# Patient Record
Sex: Female | Born: 1985 | Race: Black or African American | Hispanic: No | Marital: Single | Smoking: Former smoker
Health system: Southern US, Community
[De-identification: ages and names within clinical notes are randomized; demographics above are authoritative.]

## PROBLEM LIST (undated history)

## (undated) DIAGNOSIS — F329 Major depressive disorder, single episode, unspecified: Secondary | ICD-10-CM

## (undated) DIAGNOSIS — F32A Depression, unspecified: Secondary | ICD-10-CM

## (undated) DIAGNOSIS — M67439 Ganglion, unspecified wrist: Secondary | ICD-10-CM

## (undated) DIAGNOSIS — D869 Sarcoidosis, unspecified: Secondary | ICD-10-CM

## (undated) DIAGNOSIS — J309 Allergic rhinitis, unspecified: Secondary | ICD-10-CM

## (undated) HISTORY — DX: Major depressive disorder, single episode, unspecified: F32.9

## (undated) HISTORY — DX: Allergic rhinitis, unspecified: J30.9

## (undated) HISTORY — PX: WISDOM TOOTH EXTRACTION: SHX21

## (undated) HISTORY — DX: Depression, unspecified: F32.A

## (undated) HISTORY — PX: GANGLION CYST EXCISION: SHX1691

---

## 2014-01-10 ENCOUNTER — Emergency Department (HOSPITAL_COMMUNITY): Payer: BC Managed Care – PPO

## 2014-01-10 ENCOUNTER — Encounter (HOSPITAL_COMMUNITY): Payer: Self-pay | Admitting: Emergency Medicine

## 2014-01-10 ENCOUNTER — Emergency Department (INDEPENDENT_AMBULATORY_CARE_PROVIDER_SITE_OTHER): Payer: BC Managed Care – PPO

## 2014-01-10 ENCOUNTER — Emergency Department (INDEPENDENT_AMBULATORY_CARE_PROVIDER_SITE_OTHER)
Admission: EM | Admit: 2014-01-10 | Discharge: 2014-01-10 | Disposition: A | Discharge status: Nursing Home (Includes ICF) | Payer: BC Managed Care – PPO | Source: Home / Self Care

## 2014-01-10 ENCOUNTER — Inpatient Hospital Stay (HOSPITAL_COMMUNITY)
Admission: EM | Admit: 2014-01-10 | Discharge: 2014-01-11 | DRG: 206 | Disposition: A | Discharge status: Home / Self Care | Payer: BC Managed Care – PPO | Attending: Family Medicine | Admitting: Family Medicine

## 2014-01-10 DIAGNOSIS — R9389 Abnormal findings on diagnostic imaging of other specified body structures: Secondary | ICD-10-CM

## 2014-01-10 DIAGNOSIS — R0781 Pleurodynia: Secondary | ICD-10-CM

## 2014-01-10 DIAGNOSIS — R918 Other nonspecific abnormal finding of lung field: Secondary | ICD-10-CM | POA: Diagnosis present

## 2014-01-10 DIAGNOSIS — F1210 Cannabis abuse, uncomplicated: Secondary | ICD-10-CM | POA: Diagnosis present

## 2014-01-10 DIAGNOSIS — R071 Chest pain on breathing: Secondary | ICD-10-CM

## 2014-01-10 DIAGNOSIS — Z681 Body mass index (BMI) 19 or less, adult: Secondary | ICD-10-CM

## 2014-01-10 DIAGNOSIS — F17200 Nicotine dependence, unspecified, uncomplicated: Secondary | ICD-10-CM | POA: Diagnosis present

## 2014-01-10 DIAGNOSIS — D869 Sarcoidosis, unspecified: Secondary | ICD-10-CM | POA: Diagnosis present

## 2014-01-10 DIAGNOSIS — J984 Other disorders of lung: Secondary | ICD-10-CM

## 2014-01-10 LAB — BASIC METABOLIC PANEL
BUN: 7 mg/dL (ref 6–23)
CHLORIDE: 99 meq/L (ref 96–112)
CO2: 22 meq/L (ref 19–32)
CREATININE: 0.69 mg/dL (ref 0.50–1.10)
Calcium: 9.3 mg/dL (ref 8.4–10.5)
GFR calc Af Amer: >90 mL/min (ref >90)
GFR calc non Af Amer: >90 mL/min (ref >90)
GLUCOSE: 87 mg/dL (ref 70–99)
Potassium: 4 mEq/L (ref 3.7–5.3)
Sodium: 136 mEq/L — ABNORMAL LOW (ref 137–147)

## 2014-01-10 LAB — POCT URINALYSIS DIP (DEVICE)
Bilirubin Urine: NEGATIVE
Glucose, UA: NEGATIVE mg/dL
KETONES UR: NEGATIVE mg/dL
Leukocytes, UA: NEGATIVE
Nitrite: NEGATIVE
PROTEIN: NEGATIVE mg/dL
Specific Gravity, Urine: >=1.03 (ref 1.005–1.030)
Urobilinogen, UA: 1 mg/dL (ref 0.0–1.0)
pH: 6 (ref 5.0–8.0)

## 2014-01-10 LAB — CBC WITH DIFFERENTIAL/PLATELET
Basophils Absolute: 0 10*3/uL (ref 0.0–0.1)
Basophils Relative: 1 % (ref 0–1)
Eosinophils Absolute: 0.4 10*3/uL (ref 0.0–0.7)
Eosinophils Relative: 6 % — ABNORMAL HIGH (ref 0–5)
HEMATOCRIT: 39.8 % (ref 36.0–46.0)
Hemoglobin: 14 g/dL (ref 12.0–15.0)
LYMPHS ABS: 1.3 10*3/uL (ref 0.7–4.0)
LYMPHS PCT: 20 % (ref 12–46)
MCH: 32.3 pg (ref 26.0–34.0)
MCHC: 35.2 g/dL (ref 30.0–36.0)
MCV: 91.7 fL (ref 78.0–100.0)
MONO ABS: 0.7 10*3/uL (ref 0.1–1.0)
Monocytes Relative: 11 % (ref 3–12)
NEUTROS ABS: 4.1 10*3/uL (ref 1.7–7.7)
Neutrophils Relative %: 63 % (ref 43–77)
Platelets: 298 10*3/uL (ref 150–400)
RBC: 4.34 MIL/uL (ref 3.87–5.11)
RDW: 13 % (ref 11.5–15.5)
WBC: 6.6 10*3/uL (ref 4.0–10.5)

## 2014-01-10 LAB — POCT PREGNANCY, URINE: Preg Test, Ur: NEGATIVE

## 2014-01-10 LAB — HIV ANTIBODY (ROUTINE TESTING W REFLEX): HIV: NONREACTIVE

## 2014-01-10 MED ORDER — HEPARIN SODIUM (PORCINE) 5000 UNIT/ML IJ SOLN
5000.0000 [IU] | Freq: Three times a day (TID) | INTRAMUSCULAR | Status: DC
  Administered 2014-01-10: 5000 [IU] via SUBCUTANEOUS
  Filled 2014-01-10 (×5): qty 1

## 2014-01-10 MED ORDER — IOHEXOL 350 MG/ML SOLN
80.0000 mL | Freq: Once | INTRAVENOUS | Status: AC | PRN
  Administered 2014-01-10: 55 mL via INTRAVENOUS

## 2014-01-10 MED ORDER — SODIUM CHLORIDE 0.9 % IJ SOLN
3.0000 mL | Freq: Two times a day (BID) | INTRAMUSCULAR | Status: DC
  Administered 2014-01-10 – 2014-01-11 (×2): 3 mL via INTRAVENOUS

## 2014-01-10 MED ORDER — SODIUM CHLORIDE 0.9 % IV BOLUS (SEPSIS)
500.0000 mL | Freq: Once | INTRAVENOUS | Status: AC
  Administered 2014-01-10: 500 mL via INTRAVENOUS

## 2014-01-10 MED ORDER — SODIUM CHLORIDE 0.9 % IV SOLN: 250.0000 mL | INTRAVENOUS | Status: DC | PRN

## 2014-01-10 MED ORDER — SODIUM CHLORIDE 0.9 % IJ SOLN: 3.0000 mL | INTRAMUSCULAR | Status: DC | PRN

## 2014-01-10 NOTE — ED Provider Notes (Signed)
CSN: [id number]     Arrival date & time 01/10/14  1024 History   First MD Initiated Contact with Patient 01/10/14 1103     Chief Complaint  Patient presents with  . Back Pain   (Consider location/radiation/quality/duration/timing/severity/associated sxs/prior Treatment) HPI Comments: 27 year old female complaining of pain to the left lateral and posterior lateral ribs. Pain is greatest when taking a deep breath and with any type of movement. It started approximately 2 weeks ago. She denies anterior chest pain or shortness of breath. No known injury, trauma or positive wheezing. Daily smoker.   History reviewed. No pertinent past medical history. History reviewed. No pertinent past surgical history. No family history on file. History  Substance Use Topics  . Smoking status: Current Every Day Smoker  . Smokeless tobacco: Not on file  . Alcohol Use: No   OB History   Grav Para Term Preterm Abortions TAB SAB Ect Mult Living                 Review of Systems  Constitutional: Positive for activity change. Negative for fever and chills.  HENT: Negative.   Respiratory: Positive for cough. Negative for chest tightness and shortness of breath.   Cardiovascular: Positive for chest pain. Negative for palpitations and leg swelling.  Gastrointestinal: Negative.   Genitourinary: Negative.   Musculoskeletal:       As per HPI  Skin: Negative for color change and rash.  Neurological: Negative.     Allergies  Review of patient's allergies indicates no known allergies.  Home Medications  No current outpatient prescriptions on file. BP 122/74  Pulse 78  Temp(Src) 98.6 F (37 C) (Oral)  Resp 16  SpO2 100%  LMP 12/14/2013 Physical Exam  Nursing note and vitals reviewed. Constitutional: She is oriented to person, place, and time. She appears well-developed and well-nourished. No distress.  HENT:  Head: Normocephalic and atraumatic.  Eyes: EOM are normal.  Neck: Normal range of  motion. Neck supple.  Cardiovascular: Normal rate, regular rhythm and normal heart sounds.   Pulmonary/Chest: Effort normal. She has no wheezes.  Pleural friction rub in the distant crackles in the left mid lateral and posterior lateral lung field. Note we have for chest wall tenderness. The pain cannot be reproduced with palpation. No rib tenderness.  Abdominal: Soft. There is no tenderness.  Lymphadenopathy:    She has no cervical adenopathy.  Neurological: She is alert and oriented to person, place, and time. No cranial nerve deficit.  Skin: Skin is warm and dry.  Psychiatric: She has a normal mood and affect.    ED Course  Procedures (including critical care time) Labs Review Labs Reviewed  POCT URINALYSIS DIP (DEVICE) - Abnormal; Notable for the following:    Hgb urine dipstick TRACE (*)    All other components within normal limits  POCT PREGNANCY, URINE   Imaging Review Dg Chest 2 View  01/10/2014   CLINICAL DATA:  Back pain for 2 weeks  EXAM: CHEST  2 VIEW  COMPARISON:  None.  FINDINGS: The heart size and mediastinal contours are within normal limits. There are multiple areas of consolidation involving the left upper lobe, left lower lobe, right mid and lower lung. There is no pleural effusion or pulmonary edema. The visualized skeletal structures are unremarkable.  IMPRESSION: Multi lobar pneumonia. Followup after treatment is recommended to ensure resolution.   Electronically Signed   By: Sherian Rein M.D.   On: 01/10/2014 11:55     MDM  1. Pleuritic chest pain   2. Bilateral pulmonary infiltrates on chest x-ray   3. Abnormal CXR      28 y o generally healthy appearing F with L pleuritic chest pain. Abnormal CXR with multiple consolidations. Transfer TO the ED for evaluation of pathology that may include pulmonary embolism , pneumonias, or structural lesions. Pt has no PCP  Hayden Rasmussenavid Heath Badon, NP 01/10/14 1231

## 2014-01-10 NOTE — ED Notes (Signed)
Pt           Masked  For  Protection  Self and  others

## 2014-01-10 NOTE — ED Provider Notes (Signed)
CSN: [id number]     Arrival date & time 01/10/14  1307 History   First MD Initiated Contact with Patient 01/10/14 1325     Chief Complaint  Patient presents with  . Back Pain     (Consider location/radiation/quality/duration/timing/severity/associated sxs/prior Treatment) Patient is a 28 y.o. female presenting with back pain. The history is provided by the patient.  Back Pain Associated symptoms: chest pain   Associated symptoms: no abdominal pain, no headaches, no numbness and no weakness    patient presents with back pain some mild shortness of breath and cough. Sent from urgent care after x-ray showed multifocal infiltrate on x-ray. She's had minimal sputum production. She's been feeling bad for a couple weeks. It is worse with deep breathing and movement. No fevers. No chills. No recent travel. No recent incarceration. Patient is adopted and does not know her family history.  History reviewed. No pertinent past medical history. History reviewed. No pertinent past surgical history. No family history on file. History  Substance Use Topics  . Smoking status: Current Every Day Smoker  . Smokeless tobacco: Not on file  . Alcohol Use: No   OB History   Grav Para Term Preterm Abortions TAB SAB Ect Mult Living                 Review of Systems  Constitutional: Negative for activity change and appetite change.  Eyes: Negative for pain.  Respiratory: Positive for cough and shortness of breath. Negative for chest tightness.   Cardiovascular: Positive for chest pain. Negative for leg swelling.  Gastrointestinal: Negative for nausea, vomiting, abdominal pain and diarrhea.  Genitourinary: Negative for flank pain.  Musculoskeletal: Positive for back pain. Negative for neck stiffness.  Skin: Negative for rash.  Neurological: Negative for weakness, numbness and headaches.  Psychiatric/Behavioral: Negative for behavioral problems.      Allergies  Pollen extract  Home Medications   No current outpatient prescriptions on file. BP 103/65  Pulse 64  Temp(Src) 99.1 F (37.3 C) (Oral)  Resp 16  Ht 5\' 4"  (1.626 m)  Wt 101 lb 3.2 oz (45.904 kg)  BMI 17.36 kg/m2  SpO2 100%  LMP 12/08/2013 Physical Exam  Nursing note and vitals reviewed. Constitutional: She is oriented to person, place, and time. She appears well-developed and well-nourished.  HENT:  Head: Normocephalic and atraumatic.  Eyes: EOM are normal. Pupils are equal, round, and reactive to light.  Neck: Normal range of motion. Neck supple.  Cardiovascular: Normal rate, regular rhythm and normal heart sounds.   No murmur heard. Pulmonary/Chest: Effort normal. No respiratory distress.  Mildly harsh breath sounds with some rales.  Abdominal: Soft. Bowel sounds are normal. She exhibits no distension. There is no tenderness. There is no rebound and no guarding.  Musculoskeletal: Normal range of motion.  Neurological: She is alert and oriented to person, place, and time. No cranial nerve deficit.  Skin: Skin is warm and dry.  Psychiatric: She has a normal mood and affect. Her speech is normal.    ED Course  Procedures (including critical care time) Labs Review Labs Reviewed  CBC WITH DIFFERENTIAL - Abnormal; Notable for the following:    Eosinophils Relative 6 (*)    All other components within normal limits  BASIC METABOLIC PANEL - Abnormal; Notable for the following:    Sodium 136 (*)    All other components within normal limits  HIV ANTIBODY (ROUTINE TESTING)   Imaging Review Dg Chest 2 View  01/10/2014   CLINICAL  DATA:  Back pain for 2 weeks  EXAM: CHEST  2 VIEW  COMPARISON:  None.  FINDINGS: The heart size and mediastinal contours are within normal limits. There are multiple areas of consolidation involving the left upper lobe, left lower lobe, right mid and lower lung. There is no pleural effusion or pulmonary edema. The visualized skeletal structures are unremarkable.  IMPRESSION: Multi lobar  pneumonia. Followup after treatment is recommended to ensure resolution.   Electronically Signed   By: Sherian Rein M.D.   On: 01/10/2014 11:55   Ct Angio Chest W/cm &/or Wo Cm  01/10/2014   CLINICAL DATA:  Chest pain  EXAM: CT ANGIOGRAPHY CHEST WITH CONTRAST  TECHNIQUE: Multidetector CT imaging of the chest was performed using the standard protocol during bolus administration of intravenous contrast. Multiplanar CT image reconstructions and MIPs were obtained to evaluate the vascular anatomy.  CONTRAST:  55mL OMNIPAQUE IOHEXOL 350 MG/ML SOLN  COMPARISON:  None.  FINDINGS: There is adequate opacification of the pulmonary arteries. There is no pulmonary embolus. The main pulmonary artery, right main pulmonary artery and left main pulmonary arteries are normal in size. The heart size is normal. There is no pericardial effusion.  There are focal parenchymal opacities involving bilateral upper lobes, right middle lobe and bilateral lower lobes. The largest area in the left upper lobe measures 3.6 cm. There is an adjacent 2.8 cm spiculated area of airspace disease with areas of cavitation. The largest area in the left lower lobe 4.5 cm. The largest area in the right middle lobe measures 4.9 cm. The largest area in the right lower lobe may reduce 3.3 cm with a large area of cavitation. There is right hilar lymphadenopathy measuring 14 mm in short axis. There is left axillary lymphadenopathy measuring 11 mm in short axis. There is subcarinal lymphadenopathy measuring 18 mm in short axis. There is prevascular adenopathy measuring 10 mm in short axis.  There is no axillary, hilar, or mediastinal adenopathy.  There is no lytic or blastic osseous lesion.  The visualized portions of the upper abdomen are unremarkable.  Review of the MIP images confirms the above findings.  IMPRESSION: 1. No CT evidence of pulmonary embolus. 2. Bilateral areas of focal consolidation with a large area of cavitation in the largest right lower  lobe consolidative area and small areas of cavitation in a left upper lobe consolidative area. This may reflect multilobar pneumonia with areas of necrotizing pneumonia versus tuberculosis versus Wegner's granulomatosis.   Electronically Signed   By: Elige Ko   On: 01/10/2014 15:22     EKG Interpretation None      MDM   Final diagnoses:  Pulmonary infiltrate    Patient with multifocal pulmonary infiltrate. No pulmonary embolism. Could be multifocal pneumonia versus TB versus other cause. Will leave antibiotic choice up to admitting team. Will admit to family practice since patient does not have a PCP    Juliet Rude. Rubin Payor, MD 01/10/14 1553

## 2014-01-10 NOTE — ED Notes (Signed)
Pt  Reports  Pain  l  Upper  Back       With  Symptoms  For  sev     Weeks      Pt  denys  Any  Injury          Pt  Reports   Pain     Worse  On  Movement and  posistion        Pt  denys  Any    Urinary  Symptoms

## 2014-01-10 NOTE — H&P (Signed)
[facility] Admission History and Physical Service Pager: [phone]  Patient name: Ariel Henson Medical record number: [id number] Date of birth: 1986/01/10 Age: 28 y.o. Gender: female  Primary Care Provider: No PCP Per Patient Consultants: Pulmonology  Code Status: Full   Chief Complaint: Dyspnea, cavitary lung lesions   Assessment and Plan: Ariel Henson is a 28 y.o. female presenting with cavitary lung lessions . PMH is significant for tobacco use.  #Dyspnea/Cavitary Lung Lesions - Pt found to have RLL and LUL cavitary lung lesions on CT, no evidence of PE.  No clinical features to suggest TB picture.  DDx includes autoimmune disease such as Wegener's/Goodpastures (no evidence of renal involvement), TB latent, hypersensitive pneumonitis (occupational hazard), malignancy (28 y/o w/o other Sx), infectious (no fever, no leukocytosis), alpha 1 antitrypsin (low likelihood), or possible previous fungal exposure (aspergillosis, histoplasmosis, actinomycosis, coccidioidosis, blastomycosis).  - Admit to med surg, vitals per unit w/ pulse oximetry check  - Since pt is afebrile and no leukocytosis, will hold off on ABx tx.  If becomes febrile, would draw BCx, start on clindamycin for her cavitary lesions and discuss case with pharmacy.  - Consult to Pulmonology, greatly appreciate assessment and recommendations.  Discussed with pt she may need BAL if lab testing is equivocal.  - Airborne precautions and Quantiferon Gold, Sputum Cxs - CBC and CMET in AM, repeat CXR as indicated - F/U HIV    FEN/GI: Regular, SLIV Prophylaxis: SQ Hep   Disposition: Med-Surg pending further evaluation   History of Present Illness: Ariel Henson is a 28 y.o. female presenting with cavitary lung lesions.  Pt states that she started to have left chest pain about 2 weeks ago, sharp in nature, that has been pretty consistent, low pain, until last night when she had more severe pleuritic chest  pain last PM.  States she has had slight non productive cough, no fever, no chills, no night sweats, no N/V/D, no recent prison visits, denies IVDA, denies heavy alcohol use, denies recent travel outside of [place] in the past 6 months.  Pt has had one sexual partner in the past 3 months, protected, and has not been tested for HIV prior to this.  Denies recent travel into caves or to the [place] or [place].  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  Does smoke about 1/3 ppd for the last 7 yrs.  Specifically, denies any arthralgias, hematuria, low back pain, epistaxis, hemoptysis, skin lesions or rash.    Pt went initially to urgent care where they performed a CXR showing these lesions and she was sent to the ED for further evaluation.  As well, BMET performed which did not show gross abnormalities, CBC w/o leukocytosis or left shift, and CTA showing two regions (RLL and LUL) of cavitary lesions.   Pt is adopted so she is unsure of her history.  Previously lived in [place] in 1 story house, built in the last 25 yrs, and moved to [place] in 2009.   Review Of Systems: Per HPI   Patient Active Problem List   Diagnosis Date Noted  . Pulmonary infiltrates 01/10/2014   Past Medical History: History reviewed. No pertinent past medical history. Past Surgical History: History reviewed. No pertinent past surgical history. Social History: History  Substance Use Topics  . Smoking status: Current Every Day Smoker  . Smokeless tobacco: Not on file  . Alcohol  Use: No    Family History: No family history on file. Allergies and Medications: Allergies  Allergen Reactions  . Pollen Extract Other (See Comments)    Seasonal allergies - Runny nose   No current facility-administered medications on file prior to encounter.   No current outpatient prescriptions on file prior to encounter.     Objective: BP 113/68  Pulse 69  Temp(Src) 99.1 F (37.3 C) (Oral)  Resp 16  Ht 5\' 4"  (1.626 m)  Wt 101 lb 3.2 oz (45.904 kg)  BMI 17.36 kg/m2  SpO2 100%  LMP 12/08/2013 Exam: General: NAD, comfortable in bed HEENT: [place]/AT, clear O/P, MMM, EOMI B/L, no scleral icterus  Neck: No LAD Cardiovascular: RRR, no murmurs appreciated  Respiratory: No respiratory distress, no increased WOB, no accessory muscle use Abdomen: Soft/ NT/ND, NABS, No CVA tenderness  Extremities:  No edema, + 2 LE pulses B/L  Skin: No rashes, epidermal growths, moles, or erythema  Neuro: CN 2-12 intact, no focal deficits  Labs and Imaging: CBC BMET   Recent Labs Lab 01/10/14 1337  WBC 6.6  HGB 14.0  HCT 39.8  PLT 298    Recent Labs Lab 01/10/14 1337  NA 136*  K 4.0  CL 99  CO2 22  BUN 7  CREATININE 0.69  GLUCOSE 87  CALCIUM 9.3     CTA 01/10/14 IMPRESSION:  1. No CT evidence of pulmonary embolus.  2. Bilateral areas of focal consolidation with a large area of  cavitation in the largest right lower lobe consolidative area and  small areas of cavitation in a left upper lobe consolidative area.  This may reflect multilobar pneumonia with areas of necrotizing  pneumonia versus tuberculosis versus Wegner's granulomatosis.   Twana FirstBryan R. Paulina FusiHess, DO of [facility] 01/10/2014, 5:48 PM

## 2014-01-10 NOTE — ED Provider Notes (Signed)
Medical screening examination/treatment/procedure(s) were performed by non-physician practitioner and as supervising physician I was immediately available for consultation/collaboration.  Enio Hornback, M.D.  Undra Harriman C Shonta Phillis, MD 01/10/14 2257 

## 2014-01-10 NOTE — Consult Note (Addendum)
Name: Samul DadaOdette Vorndran MRN: [id number] DOB: 1986/03/21    ADMISSION DATE:  01/10/2014 CONSULTATION DATE:  3/19 REFERRING MD :  Gwendolyn GrantWalden  PRIMARY SERVICE:  IMTS  CHIEF COMPLAINT:  Chest pain and cavitary lung lesions   BRIEF PATIENT DESCRIPTION:  This is a 28 year old w/ no sig medical history. Admitted from urgent care on 3/19 for further evaluation of bilateral focal consolidations w/ large areas of consolidations. PCCM has been asked to eval and assist w/ further evaluation of abnormal CT findings.   SIGNIFICANT EVENTS / STUDIES:  CT chest 3/19: 2. Bilateral areas of focal consolidation with a large area of cavitation in the largest right lower lobe consolidative area and small areas of cavitation in a left upper lobe consolidative area. This may reflect multilobar pneumonia with areas of necrotizing pneumonia versus tuberculosis versus Wegner's granulomatosis.    LINES / TUBES:   CULTURES:   ANTIBIOTICS:   HISTORY OF PRESENT ILLNESS:   28 y.o. female presented to ER w/ CC: 2 wk h/o left sided CP. Went to urgent care where CXR showed cavitary lung lesions. Pt states that she started to have left chest pain about 2 weeks ago, sharp in nature, that has been pretty consistent, low pain, until the night prior to admit where it worsened in intensity. States she has had slight non productive cough, no fever, no chills, no night sweats, no N/V/D, no recent prison visits, denies IVDA, denied heavy alcohol use, denies recent travel outside of [place] in the past 6 months. Pt has had one sexual partner in the past 3 months, protected, and has not been tested for HIV prior to this. Den[place] recent travel into [place]. 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 Does smoke about 1/3 ppd for the last 7 yrs. Specifically, denies any arthralgias, hematuria, low back pain,  epistaxis, hemoptysis, skin lesions or rash.    PAST MEDICAL HISTORY :  Past Medical History  Diagnosis Date  . Herpes    History reviewed. No pertinent past surgical history. Prior to Admission medications   Not on File   Allergies  Allergen Reactions  . Pollen Extract Other (See Comments)    Seasonal allergies - Runny nose    FAMILY HISTORY:  No family history on file. SOCIAL HISTORY:  reports that she has been smoking.  She does not have any smokeless tobacco history on file. She reports that she does not drink alcohol. Her drug history is not on file.  REVIEW OF SYSTEMS:   Constitutional: Negative for fever, chills, weight loss, malaise/fatigue and diaphoresis.  HENT: Negative for hearing loss, ear pain, nosebleeds, congestion, sore throat, neck pain, tinnitus and ear discharge.   Eyes: Negative for blurred vision, double vision, photophobia, pain, discharge and redness.  Respiratory: Negative for cough, hemoptysis, sputum production, shortness of breath, wheezing and stridor.  Pleuritic type chest pain Cardiovascular: Negative for chest pain, palpitations, orthopnea, claudication, leg swelling and PND.  Gastrointestinal: Negative for heartburn, nausea, vomiting, abdominal pain, diarrhea, constipation, blood in stool and melena.  Genitourinary: Negative for dysuria, urgency, frequency, hematuria and flank pain.  Musculoskeletal: Negative for myalgias, back pain, joint pain and falls.  Skin: Negative for itching and rash.  Neurological: Negative for dizziness, tingling, tremors, sensory change, speech change, focal weakness, seizures, loss of consciousness, weakness and headaches.  Endo/Heme/Allergies: Negative for environmental allergies  and polydipsia. Does not bruise/bleed easily.  SUBJECTIVE:  No distress   VITAL SIGNS: Temp:  [98.6 F (37 C)-99.1 F (37.3 C)] 98.8 F (37.1 C) (03/19 2039) Pulse Rate:  [52-78] 73 (03/19 2039) Resp:  [16-22] 20 (03/19 2039) BP:  (103-122)/(57-77) 110/57 mmHg (03/19 2039) SpO2:  [98 %-100 %] 98 % (03/19 2039) Weight:  [45.904 kg (101 lb 3.2 oz)] 45.904 kg (101 lb 3.2 oz) (03/19 1316)  PHYSICAL EXAMINATION: General:  No acute distress  Neuro:  Awake, alert, no focal def  HEENT:  [place], no JVD Cardiovascular:  rrr Lungs:  Clear  Abdomen:  Soft, non-tender  Musculoskeletal:  Intact, no deformities  Skin:  No rashes.    Recent Labs Lab 01/10/14 1337  NA 136*  K 4.0  CL 99  CO2 22  BUN 7  CREATININE 0.69  GLUCOSE 87    Recent Labs Lab 01/10/14 1337  HGB 14.0  HCT 39.8  WBC 6.6  PLT 298   Dg Chest 2 View  01/10/2014   CLINICAL DATA:  Back pain for 2 weeks  EXAM: CHEST  2 VIEW  COMPARISON:  None.  FINDINGS: The heart size and mediastinal contours are within normal limits. There are multiple areas of consolidation involving the left upper lobe, left lower lobe, right mid and lower lung. There is no pleural effusion or pulmonary edema. The visualized skeletal structures are unremarkable.  IMPRESSION: Multi lobar pneumonia. Followup after treatment is recommended to ensure resolution.   Electronically Signed   By: Sherian Rein M.D.   On: 01/10/2014 11:55   Ct Angio Chest W/cm &/or Wo Cm  01/10/2014   CLINICAL DATA:  Chest pain  EXAM: CT ANGIOGRAPHY CHEST WITH CONTRAST  TECHNIQUE: Multidetector CT imaging of the chest was performed using the standard protocol during bolus administration of intravenous contrast. Multiplanar CT image reconstructions and MIPs were obtained to evaluate the vascular anatomy.  CONTRAST:  55mL OMNIPAQUE IOHEXOL 350 MG/ML SOLN  COMPARISON:  None.  FINDINGS: There is adequate opacification of the pulmonary arteries. There is no pulmonary embolus. The main pulmonary artery, right main pulmonary artery and left main pulmonary arteries are normal in size. The heart size is normal. There is no pericardial effusion.  There are focal parenchymal opacities involving bilateral upper lobes, right  middle lobe and bilateral lower lobes. The largest area in the left upper lobe measures 3.6 cm. There is an adjacent 2.8 cm spiculated area of airspace disease with areas of cavitation. The largest area in the left lower lobe 4.5 cm. The largest area in the right middle lobe measures 4.9 cm. The largest area in the right lower lobe may reduce 3.3 cm with a large area of cavitation. There is right hilar lymphadenopathy measuring 14 mm in short axis. There is left axillary lymphadenopathy measuring 11 mm in short axis. There is subcarinal lymphadenopathy measuring 18 mm in short axis. There is prevascular adenopathy measuring 10 mm in short axis.  There is no axillary, hilar, or mediastinal adenopathy.  There is no lytic or blastic osseous lesion.  The visualized portions of the upper abdomen are unremarkable.  Review of the MIP images confirms the above findings.  IMPRESSION: 1. No CT evidence of pulmonary embolus. 2. Bilateral areas of focal consolidation with a large area of cavitation in the largest right lower lobe consolidative area and small areas of cavitation in a left upper lobe consolidative area. This may reflect multilobar pneumonia with areas of necrotizing pneumonia versus tuberculosis versus  Wegner's granulomatosis.   Electronically Signed   By: Elige Ko   On: 01/10/2014 15:22    ASSESSMENT / PLAN: Bilateral scattered cavitary lung masses of unclear etiology. Her physical exam would argue against infectious etiology. DD includes sarcoid (generally does not cavitate) or berylliosis given aerospace industry work, less likely ANCA related or  Malignancy or infectious (no prodrome to suggest TB or infective endocarditis or exposure for  unusual fungal etiology )  plan Send ANA, ANCA, ace level, RA factor and sed rate F/u our office next week If above work-up negative we will need to get tissue sample. Likely CT guided sampling of LLL mass like infiltratewould give best yield.  Smoking  cessation emphasized Rpt UA for RBCs given trace blood  Quantiferon Gold for completion, since she is not coughing , I do not believe she is any risk to community  I can arrange FU   Oretha Milch  Pulmonary and Critical Care Medicine [facility] Pager: [phone]  01/10/2014, 11:16 PM

## 2014-01-10 NOTE — ED Notes (Signed)
Back pain left sid x 2 weeks [facility] and was told may have pneu here for further tests

## 2014-01-10 NOTE — ED Notes (Signed)
Pt moved to negative pressure room..

## 2014-01-10 NOTE — ED Notes (Signed)
Patient brought to x-ray to get CXR. LMP over a month ago, possibility of being pregnant. Returned patient to room and notified nurse Pia MauBowles of possible pregnancy

## 2014-01-11 DIAGNOSIS — R918 Other nonspecific abnormal finding of lung field: Secondary | ICD-10-CM

## 2014-01-11 DIAGNOSIS — J984 Other disorders of lung: Principal | ICD-10-CM

## 2014-01-11 LAB — CBC
HCT: 38.3 % (ref 36.0–46.0)
Hemoglobin: 13.2 g/dL (ref 12.0–15.0)
MCH: 31.7 pg (ref 26.0–34.0)
MCHC: 34.5 g/dL (ref 30.0–36.0)
MCV: 91.8 fL (ref 78.0–100.0)
Platelets: 291 10*3/uL (ref 150–400)
RBC: 4.17 MIL/uL (ref 3.87–5.11)
RDW: 13.1 % (ref 11.5–15.5)
WBC: 6.4 10*3/uL (ref 4.0–10.5)

## 2014-01-11 LAB — RAPID URINE DRUG SCREEN, HOSP PERFORMED
Amphetamines: NOT DETECTED
Barbiturates: NOT DETECTED
Benzodiazepines: NOT DETECTED
Cocaine: NOT DETECTED
Opiates: NOT DETECTED
Tetrahydrocannabinol: POSITIVE — AB

## 2014-01-11 LAB — COMPREHENSIVE METABOLIC PANEL
ALT: 12 U/L (ref 0–35)
AST: 24 U/L (ref 0–37)
Albumin: 3 g/dL — ABNORMAL LOW (ref 3.5–5.2)
Alkaline Phosphatase: 77 U/L (ref 39–117)
BUN: 9 mg/dL (ref 6–23)
CO2: 24 meq/L (ref 19–32)
Calcium: 9.2 mg/dL (ref 8.4–10.5)
Chloride: 102 mEq/L (ref 96–112)
Creatinine, Ser: 0.77 mg/dL (ref 0.50–1.10)
GFR calc Af Amer: >90 mL/min (ref >90)
Glucose, Bld: 96 mg/dL (ref 70–99)
Potassium: 3.8 mEq/L (ref 3.7–5.3)
SODIUM: 139 meq/L (ref 137–147)
TOTAL PROTEIN: 7.4 g/dL (ref 6.0–8.3)
Total Bilirubin: 0.5 mg/dL (ref 0.3–1.2)

## 2014-01-11 LAB — ANGIOTENSIN CONVERTING ENZYME: Angiotensin-Converting Enzyme: 39 U/L (ref 8–52)

## 2014-01-11 LAB — SEDIMENTATION RATE: SED RATE: 39 mm/h — AB (ref 0–22)

## 2014-01-11 MED ORDER — ACETAMINOPHEN 325 MG PO TABS
650.0000 mg | ORAL_TABLET | Freq: Four times a day (QID) | ORAL | Status: DC | PRN
  Administered 2014-01-11: 650 mg via ORAL
  Filled 2014-01-11: qty 2

## 2014-01-11 NOTE — H&P (Signed)
FMTS Attending Admission Note: Renold DonJeff Heily Carlucci MD Personal pager:  [phone] FPTS Service Pager:  [phone]  I  have seen and examined this patient, reviewed their chart. I have discussed this patient with the resident. I agree with the resident's findings, assessment and care plan.  Additionally:  28 yo with no real PMH, adopted so unknonw family hx, who presents with 2 weeks worth of dyspnea and aching back pain which became sharp stabbing pain yesterday.  Presented to Urgent Care for sharp back pain. FOund to have multiple opacities on lung exam, sent to ED. CTA revealed multiple cavitary lesions. Admitted to FPTS.  Exam:   Gen:  AAF, appears stated age, thin but well developed, NAD Lungs:  Clear throughout Heart: RRR Abdomen:  Thin/NT  Imp/Plan: 1. Cavitary lesion; - pulm already consulted, appreciate input.   - broad diff dx:  TB less likely, possibly autoimmune vs fungal - agree with autoimmune labs.  May need tissue samples   -Tobey GrimJeffrey H Marivel Mcclarty, MD 01/11/2014 8:09 AM

## 2014-01-11 NOTE — Progress Notes (Signed)
FMTS ATTENDING  NOTE Ada Holness,MD I have discussed this patient with the resident. I agree with the resident's findings, assessment and care plan.   

## 2014-01-11 NOTE — Progress Notes (Signed)
Called and discussed with ID on-call regarding patient and quantiferon gold test. As she is not having any other signs or symptoms of TB, she does not need to be on airborne precautions and does not need to be kept in the hospital until this result comes back. Overall FPTS and pulmonology does not believe her CXR and CT chest findings are related to TB. If the result does come back positive, it is latent TB and not active disease.  Airborne precautions removed and patient stable for discharge today.  Tawni CarnesAndrew Chrisette Man, MD 01/11/2014, 2:35 PM PGY-1, [facility] FPTS Intern Pager: [phone], text pages welcome

## 2014-01-11 NOTE — Discharge Instructions (Signed)
You were admitted for concern of pneumonia based on your chest x-ray and CT scan. You had no other symptoms of infection and were not started on antibiotics. We had a pulmonologist come and see you to help with the workup to find the cause for the cavitary areas in your lung. We did additional blood testing today that will be pending for several days; the results will be discussed with you at your follow up visits.   Since your tuberculosis test did not come back before you were discharged, it is very important that if you develop fevers, cough, that you do not go to public places and come back to the hospital (let the reception at the emergency department know that you are being tested for TB).  It is very unlikely that you have "Active TB", but there is the possibility that this test comes back positive and that would mean you have "Latent TB", which means you have had an infection in the past. If that is the case, you will still likely need antibiotic therapy, we will likely get you setup with an ID doctor if this is the case.

## 2014-01-11 NOTE — Discharge Summary (Signed)
[facility] Discharge Summary  Patient name: Ariel Henson Medical record number: [id number] Date of birth: [birth year] Age: 28 y.o. 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 female was admitted for left pleuritic chest pain and multilobar cavitary lung lesions on chest x-ray and CT. PMH significant for tobacco and marijuana use. Patient denies risk factors for TB, was afebrile and without cough. She was admitted for further workup; pulmonary consulted and believed this to be very unlikely infectious etiology, favored autoimmune (sarcoid, ANCA related), or berylliosis as patient works with metal on wings at [employer]. Workup including HIV (non-reactive), quantiferon gold TB test, autoimmune workup (see below) drawn and patient was monitored overnight without issues. Discussed with Infectious Disease and given low likelihood of TB, and even if she tests positive for TB she is currently not in active disease, she was stable for discharge home for continued workup as an outpatient.  Issues for Follow Up:  1. Cavitary lesions: pending workup on discharge, follow up scheduled with pulmonary 2. Tobacco/marijuana use: denied marijuana use, will need counseling regarding smoking cessation  Significant Procedures: none  Significant Labs and Imaging:   Recent Labs Lab 01/10/14 1337 01/11/14 0500  WBC 6.6 6.4  HGB 14.0 13.2  HCT 39.8 38.3  PLT 298 291    Recent Labs Lab 01/10/14 1337 01/11/14 0500  NA 136* 139   K 4.0 3.8  CL 99 102  CO2 22 24  GLUCOSE 87 96  BUN 7 9  CREATININE 0.69 0.77  CALCIUM 9.3 9.2  ALKPHOS  --  77  AST  --  24  ALT  --  12  ALBUMIN  --  3.0*   HIV non-reactive Urine drug screen: positive for THC UA: trace hemoglobin ESR: 39 ACE level: 39  Dg Chest 2 View  01/10/2014   CLINICAL DATA:  Back pain for 2 weeks  EXAM: CHEST  2 VIEW  COMPARISON:  None.  FINDINGS: The heart size and mediastinal contours are within normal limits. There are multiple areas of consolidation involving the left upper lobe, left lower lobe, right mid and lower lung. There is no pleural effusion or pulmonary edema. The visualized skeletal structures are unremarkable.  IMPRESSION: Multi lobar pneumonia. Followup after treatment is recommended to ensure resolution.   Electronically Signed   By: Abelardo Diesel M.D.   On: 01/10/2014 11:55   Ct Angio Chest W/cm &/or Wo Cm  01/10/2014   CLINICAL DATA:  Chest pain  EXAM: CT ANGIOGRAPHY CHEST WITH CONTRAST  TECHNIQUE: Multidetector CT imaging of the chest was performed using the standard protocol during bolus administration of intravenous contrast. Multiplanar CT image reconstructions and MIPs were obtained to evaluate the vascular anatomy.  CONTRAST:  72mL OMNIPAQUE IOHEXOL 350 MG/ML SOLN  COMPARISON:  None.  FINDINGS: There is adequate opacification of the pulmonary arteries. There is no pulmonary embolus. The main pulmonary artery, right main pulmonary artery and left main pulmonary arteries are normal in size. The heart size is normal. There is no pericardial effusion.  There are focal parenchymal opacities involving bilateral upper lobes, right middle lobe and bilateral lower  lobes. The largest area in the left upper lobe measures 3.6 cm. There is an adjacent 2.8 cm spiculated area of airspace disease with areas of cavitation. The largest area in the left lower lobe 4.5 cm. The largest area in the right middle lobe measures 4.9 cm. The largest area in the right  lower lobe may reduce 3.3 cm with a large area of cavitation. There is right hilar lymphadenopathy measuring 14 mm in short axis. There is left axillary lymphadenopathy measuring 11 mm in short axis. There is subcarinal lymphadenopathy measuring 18 mm in short axis. There is prevascular adenopathy measuring 10 mm in short axis.  There is no axillary, hilar, or mediastinal adenopathy.  There is no lytic or blastic osseous lesion.  The visualized portions of the upper abdomen are unremarkable.  Review of the MIP images confirms the above findings.  IMPRESSION: 1. No CT evidence of pulmonary embolus. 2. Bilateral areas of focal consolidation with a large area of cavitation in the largest right lower lobe consolidative area and small areas of cavitation in a left upper lobe consolidative area. This may reflect multilobar pneumonia with areas of necrotizing pneumonia versus tuberculosis versus Wegner's granulomatosis.   Electronically Signed   By: Kathreen Devoid   On: 01/10/2014 15:22    Results/Tests Pending at Time of Discharge: ANCA, Quantiferon TB gold  Discharge Medications:    Medication List    Notice   You have not been prescribed any medications.      Discharge Instructions: Please refer to Patient Instructions section of EMR for full details.  Patient was counseled important signs and symptoms that should prompt return to medical care, changes in medications, dietary instructions, activity restrictions, and follow up appointments.   Follow-Up Appointments: Follow-up Information   Follow up with Rigoberto Noel., MD On 01/15/2014. (4 pm)    Specialty:  Pulmonary Disease   Contact information:   [address] [address] [phone]       Follow up with Tawanna Sat, MD On 01/28/2014. (at 11am, For hospital follow up)    Specialty:  Family Medicine   Contact information:   [address] [place] [address] [phone]       Tawanna Sat, MD 01/13/2014, 9:49  PM PGY-1, [place]

## 2014-01-11 NOTE — Progress Notes (Signed)
Family Medicine Teaching Service Daily Progress Note Intern Pager: [phone]  Patient name: Ariel [name] Medical record number: [id number] Date of birth: 08-11-1986 Age: 28 y.o. Gender: female  Primary Care Provider: No PCP Per Patient Consultants: Pulmonology Code Status: Full  Pt Overview and Major Events to Date:   Assessment and Plan: Ariel Henson is a 28 y.o. female presenting with cavitary lung lessions . PMH is significant for tobacco, marijuana use.   # Dyspnea/Cavitary Lung Lesions - Pt found to have RLL and LUL cavitary lung lesions on CT, no evidence of PE. No clinical features to suggest TB picture. DDx includes autoimmune disease such as Wegener's/Goodpastures (no evidence of renal involvement), TB latent, hypersensitive pneumonitis (occupational hazard), malignancy 54(27 y/o w/o other Sx), infectious (no fever, no leukocytosis), alpha 1 antitrypsin (low likelihood), or possible previous fungal exposure (aspergillosis, histoplasmosis, actinomycosis, coccidioidosis, blastomycosis).  - Since pt is afebrile and no leukocytosis, will hold off on ABx tx. If becomes febrile, would draw BCx, start on clindamycin for her cavitary lesions and discuss case with pharmacy.  - Consult to Pulmonology, greatly appreciate assessment and recommendations: suspect autoimmune vs berylliosis, ANCA related; labs sent = ANA, ANCA, ACE level, RA factor, sed rate. Additional workup if that is negative probable CT guided sampling.  - Airborne precautions and Quantiferon Gold, Sputum Cxs  - HIV negative. - very low suspicion for TB, will call ID to discuss need to keep her in hospital until quantiferon Gold TB test comes back  FEN/GI: Regular, SLIV  Prophylaxis: SQ Hep   Disposition: pending quant TB test  Subjective:  No complaints this morning, she is ready to go home. Left chest pain the same (in "background"). Also having some menstrual type pain this morning. No fevers, cough.  Objective: Temp:   [97.7 F (36.5 C)-99.1 F (37.3 C)] 97.7 F (36.5 C) (03/20 0418) Pulse Rate:  [52-78] 70 (03/20 0418) Resp:  [16-22] 22 (03/20 0418) BP: (88-122)/(42-77) 88/42 mmHg (03/20 0418) SpO2:  [98 %-100 %] 99 % (03/20 0418) Weight:  [101 lb 3.2 oz (45.904 kg)] 101 lb 3.2 oz (45.904 kg) (03/19 1316) Physical Exam: General: NAD, comfortable in bed  Neck: No LAD Cardiovascular: RRR, no murmurs appreciated  Respiratory: mostly clear bilaterally, a few scattered crackles. No respiratory distress, no increased WOB, no accessory muscle use  Abdomen: Soft/ NT/ND, NABS, No CVA tenderness  Extremities: No edema, + 2 LE pulses B/L  Skin: No rashes noted  Neuro: alert and oriented, no focal deficits  Laboratory:  Recent Labs Lab 01/10/14 1337 01/11/14 0500  WBC 6.6 6.4  HGB 14.0 13.2  HCT 39.8 38.3  PLT 298 291    Recent Labs Lab 01/10/14 1337 01/11/14 0500  NA 136* 139  K 4.0 3.8  CL 99 102  CO2 22 24  BUN 7 9  CREATININE 0.69 0.77  CALCIUM 9.3 9.2  PROT  --  7.4  BILITOT  --  0.5  ALKPHOS  --  77  ALT  --  12  AST  --  24  GLUCOSE 87 96    HIV non-reactive  Imaging/Diagnostic Tests: Dg Chest 2 View  01/10/2014   CLINICAL DATA:  Back pain for 2 weeks  EXAM: CHEST  2 VIEW  COMPARISON:  None.  FINDINGS: The heart size and mediastinal contours are within normal limits. There are multiple areas of consolidation involving the left upper lobe, left lower lobe, right mid and lower lung. There is no pleural effusion or pulmonary edema. The  visualized skeletal structures are unremarkable.  IMPRESSION: Multi lobar pneumonia. Followup after treatment is recommended to ensure resolution.   Electronically Signed   By: Sherian Rein M.D.   On: 01/10/2014 11:55   Ct Angio Chest W/cm &/or Wo Cm  01/10/2014   CLINICAL DATA:  Chest pain  EXAM: CT ANGIOGRAPHY CHEST WITH CONTRAST  TECHNIQUE: Multidetector CT imaging of the chest was performed using the standard protocol during bolus administration  of intravenous contrast. Multiplanar CT image reconstructions and MIPs were obtained to evaluate the vascular anatomy.  CONTRAST:  55mL OMNIPAQUE IOHEXOL 350 MG/ML SOLN  COMPARISON:  None.  FINDINGS: There is adequate opacification of the pulmonary arteries. There is no pulmonary embolus. The main pulmonary artery, right main pulmonary artery and left main pulmonary arteries are normal in size. The heart size is normal. There is no pericardial effusion.  There are focal parenchymal opacities involving bilateral upper lobes, right middle lobe and bilateral lower lobes. The largest area in the left upper lobe measures 3.6 cm. There is an adjacent 2.8 cm spiculated area of airspace disease with areas of cavitation. The largest area in the left lower lobe 4.5 cm. The largest area in the right middle lobe measures 4.9 cm. The largest area in the right lower lobe may reduce 3.3 cm with a large area of cavitation. There is right hilar lymphadenopathy measuring 14 mm in short axis. There is left axillary lymphadenopathy measuring 11 mm in short axis. There is subcarinal lymphadenopathy measuring 18 mm in short axis. There is prevascular adenopathy measuring 10 mm in short axis.  There is no axillary, hilar, or mediastinal adenopathy.  There is no lytic or blastic osseous lesion.  The visualized portions of the upper abdomen are unremarkable.  Review of the MIP images confirms the above findings.  IMPRESSION: 1. No CT evidence of pulmonary embolus. 2. Bilateral areas of focal consolidation with a large area of cavitation in the largest right lower lobe consolidative area and small areas of cavitation in a left upper lobe consolidative area. This may reflect multilobar pneumonia with areas of necrotizing pneumonia versus tuberculosis versus Wegner's granulomatosis.   Electronically Signed   By: Elige Ko   On: 01/10/2014 15:22    Tawni Carnes, MD 01/11/2014, 8:37 AM PGY-1, [facility] Family Medicine FPTS Intern  pager: [phone], text pages welcome

## 2014-01-11 NOTE — Progress Notes (Signed)
Patient blood pressure 88/42.  Asymptomatic.  MD on call paged; awaiting call back.  Will continue to monitor.

## 2014-01-12 NOTE — Progress Notes (Addendum)
Patient discharge teaching given, including activity, diet, follow-up appoints, and medications. Patient verbalized understanding of all discharge instructions. IV access was d/c'd. Vitals are stable. Skin is intact except as charted in most recent assessments. Pt to be escorted out by RN, to be driven home herself.  Peri MarisAndrew Blanche Scovell, MBA, BS, RN

## 2014-01-14 LAB — ANCA SCREEN W REFLEX TITER
Atypical p-ANCA Screen: NEGATIVE
P-ANCA SCREEN: NEGATIVE
c-ANCA Screen: NEGATIVE

## 2014-01-14 LAB — ANTI-NUCLEAR AB-TITER (ANA TITER): ANA Titer 1: 1:40 {titer} — ABNORMAL HIGH

## 2014-01-14 LAB — ANA: ANA: POSITIVE — AB

## 2014-01-14 NOTE — Discharge Summary (Signed)
FMTS ATTENDING  NOTE Ariel Pangle,MD I  have seen and examined this patient, reviewed their chart. I have discussed this patient with the resident. I agree with the resident's findings, assessment and care plan.   Briefly, suggested infectious disease and pulmonologist clearance before d/c home. Patient need close follow up with the pulmonologist and she is aware of this.

## 2014-01-15 ENCOUNTER — Encounter: Payer: Self-pay | Admitting: Pulmonary Disease

## 2014-01-15 ENCOUNTER — Ambulatory Visit (INDEPENDENT_AMBULATORY_CARE_PROVIDER_SITE_OTHER): Payer: BC Managed Care – PPO | Admitting: Pulmonary Disease

## 2014-01-15 ENCOUNTER — Encounter: Payer: Self-pay | Admitting: Emergency Medicine

## 2014-01-15 VITALS — BP 104/62 | HR 83 | Temp 98.7°F | Ht 64.0 in | Wt 104.8 lb

## 2014-01-15 DIAGNOSIS — J984 Other disorders of lung: Secondary | ICD-10-CM

## 2014-01-15 LAB — QUANTIFERON TB GOLD ASSAY (BLOOD)
Mitogen value: 0.42 IU/mL
Quantiferon Nil Value: 0.04 IU/mL
TB Ag value: 0.04 IU/mL
TB Antigen Minus Nil Value: 0 IU/mL

## 2014-01-15 MED ORDER — ACETAMINOPHEN-CODEINE #3 300-30 MG PO TABS: 1.0000 | ORAL_TABLET | Freq: Three times a day (TID) | ORAL | Status: DC | PRN

## 2014-01-15 NOTE — Patient Instructions (Signed)
Lung biopsy will be scheduled with the radiologist Pain meds Find out about exposure to BERYLLIUM at work

## 2014-01-15 NOTE — Progress Notes (Signed)
   Subjective:    Patient ID: Ariel DadaOdette Henson, female    DOB: 1986-08-10, 28 y.o.   MRN: [id number]  HPI  28 y.o. female presented to ER w/ CC: 2 wk h/o left sided CP. Went to urgent care where CXR showed cavitary lung lesions. Pt states that she started to have left chest pain about 2 weeks ago, sharp in nature, that has been pretty consistent, low pain, until the night prior to admit where it worsened in intensity. States she has had slight non productive cough, no fever, no chills, no night sweats, no N/V/D, no recent prison visits, denies IVDA, denied heavy alcohol use, denies recent travel outside of [place] in the past 6 months. Pt has had one sexual partner in the past 3 months, protected, and has not been tested for HIV prior to this. Denies recent travel into [place]. 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 Does smoke about 1/3 ppd for the last 7 yrs. Specifically, denies any arthralgias, hematuria, low back pain, epistaxis, hemoptysis, skin lesions or rash.   CT chest 3/19: 2. Bilateral areas of focal consolidation with a large area of cavitation in the largest right lower lobe consolidative area and small areas of cavitation in a left upper lobe consolidative area.   ANA 1: 40 speckled, ANCA neg, ace level 39 , RA factor and sed rate 39 Quantiferon Gold not resulted   Review of Systems neg for any significant sore throat, dysphagia, itching, sneezing, nasal congestion or excess/ purulent secretions, fever, chills, sweats, unintended wt loss, pleuritic or exertional cp, hempoptysis, orthopnea pnd or change in chronic leg swelling. Also denies presyncope, palpitations, heartburn, abdominal pain, nausea, vomiting, diarrhea or change in bowel or urinary habits, dysuria,hematuria, rash, arthralgias, visual complaints, headache, numbness weakness or ataxia.      Objective:   Physical Exam  Gen. Pleasant, well-nourished, in no distress, normal affect ENT - no lesions, no post nasal drip Neck: No JVD, no thyromegaly, no carotid bruits Lungs: no use of accessory muscles, no dullness to percussion, clear without rales or rhonchi  Cardiovascular: Rhythm regular, heart sounds  normal, no murmurs or gallops, no peripheral edema Abdomen: soft and non-tender, no hepatosplenomegaly, BS normal. Musculoskeletal: No deformities, no cyanosis or clubbing Neuro:  alert, non focal        Assessment & Plan:

## 2014-01-15 NOTE — Assessment & Plan Note (Addendum)
Sarcoid vs berylliosis, doubt malignancy Lung biopsy will be scheduled with the radiologist Pain meds -tylenol #3 q8h prn #30 Find out about exposure to BERYLLIUM at work  The various options of biopsy including bronchoscopy, CT guided needle aspiration and surgical biopsy were discussed.The risks of each procedure including coughing, bleeding and the  chances of lung puncture requiring chest tube were discussed in great detail. The benefits & alternatives including serial follow up were also discussed.

## 2014-01-18 ENCOUNTER — Encounter (HOSPITAL_COMMUNITY): Payer: Self-pay | Admitting: Pharmacy Technician

## 2014-01-18 ENCOUNTER — Other Ambulatory Visit: Payer: Self-pay | Admitting: Radiology

## 2014-01-21 ENCOUNTER — Encounter: Payer: Self-pay | Admitting: Pulmonary Disease

## 2014-01-21 ENCOUNTER — Ambulatory Visit (HOSPITAL_COMMUNITY)
Admission: RE | Admit: 2014-01-21 | Discharge: 2014-01-21 | Disposition: A | Discharge status: Home / Self Care | Payer: BC Managed Care – PPO | Source: Ambulatory Visit | Attending: Pulmonary Disease | Admitting: Pulmonary Disease

## 2014-01-21 ENCOUNTER — Ambulatory Visit (HOSPITAL_COMMUNITY)
Admission: RE | Admit: 2014-01-21 | Discharge: 2014-01-21 | Disposition: A | Discharge status: Home / Self Care | Payer: BC Managed Care – PPO | Source: Ambulatory Visit | Attending: Interventional Radiology | Admitting: Interventional Radiology

## 2014-01-21 ENCOUNTER — Encounter (HOSPITAL_COMMUNITY): Payer: Self-pay

## 2014-01-21 DIAGNOSIS — J984 Other disorders of lung: Secondary | ICD-10-CM

## 2014-01-21 DIAGNOSIS — R222 Localized swelling, mass and lump, trunk: Secondary | ICD-10-CM | POA: Insufficient documentation

## 2014-01-21 HISTORY — PX: LUNG BIOPSY: SHX232

## 2014-01-21 LAB — CBC
HCT: 39.2 % (ref 36.0–46.0)
Hemoglobin: 13.7 g/dL (ref 12.0–15.0)
MCH: 31.9 pg (ref 26.0–34.0)
MCHC: 34.9 g/dL (ref 30.0–36.0)
MCV: 91.4 fL (ref 78.0–100.0)
Platelets: 280 10*3/uL (ref 150–400)
RBC: 4.29 MIL/uL (ref 3.87–5.11)
RDW: 12.8 % (ref 11.5–15.5)
WBC: 5.9 10*3/uL (ref 4.0–10.5)

## 2014-01-21 LAB — PROTIME-INR
INR: 1.11 (ref 0.00–1.49)
Prothrombin Time: 14.1 seconds (ref 11.6–15.2)

## 2014-01-21 LAB — HCG, SERUM, QUALITATIVE: Preg, Serum: NEGATIVE

## 2014-01-21 LAB — APTT: APTT: 35 s (ref 24–37)

## 2014-01-21 MED ORDER — FENTANYL CITRATE 0.05 MG/ML IJ SOLN
INTRAMUSCULAR | Status: AC
  Filled 2014-01-21: qty 4

## 2014-01-21 MED ORDER — HYDROCODONE-ACETAMINOPHEN 5-325 MG PO TABS: 1.0000 | ORAL_TABLET | Freq: Once | ORAL | Status: DC

## 2014-01-21 MED ORDER — HYDROCODONE-ACETAMINOPHEN 5-325 MG PO TABS
1.0000 | ORAL_TABLET | Freq: Once | ORAL | Status: AC
  Administered 2014-01-21: 1 via ORAL
  Filled 2014-01-21 (×2): qty 1

## 2014-01-21 MED ORDER — MIDAZOLAM HCL 2 MG/2ML IJ SOLN
INTRAMUSCULAR | Status: AC | PRN
  Administered 2014-01-21 (×2): 1 mg via INTRAVENOUS

## 2014-01-21 MED ORDER — FENTANYL CITRATE 0.05 MG/ML IJ SOLN
INTRAMUSCULAR | Status: AC | PRN
  Administered 2014-01-21 (×2): 25 ug via INTRAVENOUS

## 2014-01-21 MED ORDER — SODIUM CHLORIDE 0.9 % IV SOLN
Freq: Once | INTRAVENOUS | Status: DC
Start: 2014-01-21 — End: 2014-01-22

## 2014-01-21 MED ORDER — MIDAZOLAM HCL 2 MG/2ML IJ SOLN
INTRAMUSCULAR | Status: AC
  Filled 2014-01-21: qty 4

## 2014-01-21 NOTE — Progress Notes (Signed)
KOREEN,PA IN TO SEE CLIENT AND OK TO D/C HOME

## 2014-01-21 NOTE — Procedures (Signed)
RLL lung Bx No comp 

## 2014-01-21 NOTE — Discharge Instructions (Signed)
Needle Biopsy of Lung, Care After °Refer to this sheet in the next few weeks. These instructions provide you with information on caring for yourself after your procedure. Your health care provider may also give you more specific instructions. Your treatment has been planned according to current medical practices, but problems sometimes occur. Call your health care provider if you have any problems or questions after your procedure. °WHAT TO EXPECT AFTER THE PROCEDURE °A bandage will be applied over the areas where the needle was inserted. You may be asked to apply pressure to the bandage for several minutes to ensure there is minimal bleeding. In most cases, you can leave when your needle biopsy procedure is completed. Do not drive yourself home. Someone else should take you home. If you received an IV sedative or general anesthetic, you will be taken to a comfortable place to relax while the medication wears off. If you have upcoming travel scheduled, talk to your doctor about when it is safe to travel by air after the procedure. °HOME CARE INSTRUCTIONS °Expect to take it easy for the rest of the day. Protect the area where you received the needle biopsy by keeping the bandage in place for as long as instructed. You may feel some mild pain or discomfort in the area, but this should stop in a day or two. Only take over-the-counter or prescription medicines for pain, discomfort, or fever as directed by your caregiver. °SEEK MEDICAL CARE IF:  °· You have pain at the biopsy site that worsens or is not helped by medication. °· You have swelling or drainage at the needle biopsy site. °· You have a fever. °SEEK IMMEDIATE MEDICAL CARE IF:  °· You have new or worsening shortness of breath. °· You have chest pain. °· You are coughing up blood. °· You have bleeding that does not stop with pressure or a bandage. °· You develop light-headedness or fainting. °Document Released: 08/08/2007 Document Revised: 06/13/2013 Document  Reviewed: 03/05/2013 °ExitCare® Patient Information ©2014 [employer]. ° °

## 2014-01-21 NOTE — Progress Notes (Signed)
DR HOSS NOTIFIED CLIENT STATES 7/10 BIOPSY SITE PAIN AND STATES PAIN SINCE WENT FOR CXR ; ORDER NOTED AND MED GIVEN

## 2014-01-21 NOTE — Progress Notes (Signed)
CLIENT'S MOTHER STATES BIOPSY SITE LOOKS SWOLLEN AND PA KOREEN NOTIFED AND SHE WILL BE IN TO SEE CLIENT

## 2014-01-21 NOTE — H&P (Signed)
Ariel Henson is an 28 y.o. female.   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 Herpes     History reviewed. No pertinent past surgical history.  No family history on file. Social History:  reports that she has been smoking.  She does not have any smokeless tobacco history on file. She reports that she does not drink alcohol. Her drug history is not on file.  Allergies:  Allergies  Allergen Reactions  . Pollen Extract Other (See Comments)    Seasonal allergies - Runny nose     (Not in a hospital admission)  Results for orders placed during the hospital encounter of 01/21/14 (from the past 48 hour(s))  APTT     Status: None   Collection Time    01/21/14  8:06 AM      Result Value Ref Range   aPTT 35  24 - 37 seconds  CBC     Status: None   Collection Time    01/21/14  8:06 AM      Result Value Ref Range   WBC 5.9  4.0 - 10.5 K/uL   RBC 4.29  3.87 - 5.11 MIL/uL   Hemoglobin 13.7  12.0 - 15.0 g/dL   HCT 16.139.2  09.636.0 - 04.546.0 %   MCV 91.4  78.0 - 100.0 fL   MCH 31.9  26.0 - 34.0 pg   MCHC 34.9  30.0 - 36.0 g/dL   RDW 40.912.8  81.111.5 - 91.415.5 %   Platelets 280  150 - 400 K/uL  PROTIME-INR     Status: None   Collection Time    01/21/14  8:06 AM      Result Value Ref Range   Prothrombin Time 14.1  11.6 - 15.2 seconds   INR 1.11  0.00 - 1.49  HCG, SERUM, QUALITATIVE     Status: None   Collection Time    01/21/14  8:23 AM      Result Value Ref Range   Preg, Serum NEGATIVE  NEGATIVE   Comment:            THE SENSITIVITY OF THIS     METHODOLOGY IS >10 mIU/mL.   No results found.  Review of Systems  Constitutional: Negative for fever, weight loss and diaphoresis.  Respiratory: Negative for cough, hemoptysis, sputum  production and shortness of breath.   Cardiovascular: Positive for chest pain.  Gastrointestinal: Negative for nausea, vomiting and abdominal pain.  Musculoskeletal: Positive for back pain.  Neurological: Negative for dizziness, weakness and headaches.  Psychiatric/Behavioral: Positive for substance abuse.       Smoker    Blood pressure 109/68, pulse 80, temperature 97.5 F (36.4 C), temperature source Oral, resp. rate 16, height 5\' 4"  (1.626 m), weight 47.174 kg (104 lb), last menstrual period 01/11/2014, SpO2 100.00%. Physical Exam  Constitutional: She is oriented to person, place, and time. She appears well-developed and well-nourished.  Neck: Normal range of motion.  Cardiovascular: Normal rate, regular rhythm and normal heart sounds.   No murmur heard. Respiratory: Effort normal and breath sounds normal. She has no wheezes.  GI: Soft. Bowel sounds are normal. There is no tenderness.  Musculoskeletal: Normal range of motion.  Neurological: She is alert and oriented to  person, place, and time.  Skin: Skin is warm and dry.  Psychiatric: She has a normal mood and affect. Her behavior is normal. Judgment and thought content normal.     Assessment/Plan L chest and back pain for few weeks Abn CXR and CT chest 01/10/14 Was seen and evaluated with Dr Vassie Loll Now scheduled for L lung mass biopsy Pt aware of procedure benefits and risks and agreeable top proceed Consent signed and in chart  Ariel Henson A 01/21/2014, 9:19 AM

## 2014-01-24 ENCOUNTER — Telehealth: Payer: Self-pay | Admitting: Pulmonary Disease

## 2014-01-24 ENCOUNTER — Encounter: Payer: Self-pay | Admitting: *Deleted

## 2014-01-24 NOTE — Telephone Encounter (Signed)
Called and spoke with pt and she is aware of results per RA.  Pt is aware that RA will speak with pathologist and will get back in touch with her.  Nothing further is needed.

## 2014-01-28 ENCOUNTER — Ambulatory Visit (INDEPENDENT_AMBULATORY_CARE_PROVIDER_SITE_OTHER): Payer: BC Managed Care – PPO | Admitting: Family Medicine

## 2014-01-28 ENCOUNTER — Encounter: Payer: Self-pay | Admitting: Family Medicine

## 2014-01-28 VITALS — BP 104/73 | HR 99 | Temp 98.6°F | Ht 64.0 in | Wt 102.0 lb

## 2014-01-28 DIAGNOSIS — Z0000 Encounter for general adult medical examination without abnormal findings: Secondary | ICD-10-CM

## 2014-01-28 DIAGNOSIS — J984 Other disorders of lung: Secondary | ICD-10-CM

## 2014-01-28 NOTE — Progress Notes (Signed)
Patient ID: Ariel DadaOdette Daddona, female   DOB: 01-26-1986, 26 y.o.   MRN: [id number]   Subjective:    Patient ID: Ariel Henson Panzer, female    DOB: 01-26-1986, 28 y.o.   MRN: [id number]  HPI  CC: hospital f/u  # Cavitary lung lesions:  Followed by pulm, has had biopsy that suspects cause of her lesions to be sarcoidosis  Not currently having any difficulty breathing ROS: denies fevers, chills, some left sided and epigastric pains intermittently  # Healthcare maintenance  Due for pap smear (thinks last time was 2 years ago)  Review of Systems   See HPI for ROS. Objective:  BP 104/73  Pulse 99  Temp(Src) 98.6 F (37 C) (Oral)  Ht 5\' 4"  (1.626 m)  Wt 102 lb (46.267 kg)  BMI 17.50 kg/m2  SpO2 97%  LMP 01/11/2014  General: NAD, thin AA female Cardiac: RRR, normal heart sounds, no murmurs. 2+ radial and PT pulses bilaterally Respiratory: CTAB, normal effort. No crackles appreciated Extremities: no edema or cyanosis. WWP. Skin: warm and dry, no rashes noted Neuro: alert and oriented, no focal deficits     Assessment & Plan:  See Problem List Documentation

## 2014-01-28 NOTE — Assessment & Plan Note (Signed)
P: schedule pap smear for 1-2 weeks

## 2014-01-29 ENCOUNTER — Ambulatory Visit: Payer: BC Managed Care – PPO | Admitting: Adult Health

## 2014-02-01 ENCOUNTER — Ambulatory Visit (INDEPENDENT_AMBULATORY_CARE_PROVIDER_SITE_OTHER): Payer: BC Managed Care – PPO | Admitting: Adult Health

## 2014-02-01 ENCOUNTER — Encounter: Payer: Self-pay | Admitting: Adult Health

## 2014-02-01 VITALS — BP 120/64 | HR 81 | Temp 98.6°F | Ht 64.0 in | Wt 102.0 lb

## 2014-02-01 DIAGNOSIS — J984 Other disorders of lung: Secondary | ICD-10-CM

## 2014-02-01 MED ORDER — PREDNISONE 10 MG PO TABS
ORAL_TABLET | ORAL | Status: DC
Start: 2014-02-01 — End: 2014-03-14

## 2014-02-01 NOTE — Patient Instructions (Addendum)
Must quit smoking .  follow up Dr. Vassie LollAlva  In 4 weeks with PFT and chest xray  I will call regarding possible treatment options after I speak with Dr. Vassie LollAlva     Begin Prednisone 20mg  daily for 2 weeks then 10mg  daily and hold .

## 2014-02-01 NOTE — Progress Notes (Signed)
   Subjective:    Patient ID: Ariel Henson, female    DOB: [birth year], 28 y.o.   MRN: [id number]  HPI 28 y.o. female presented to ER w/ CC: 2 wk h/o left sided CP. Went to urgent care where CXR showed cavitary lung lesions. Pt states that she started to have left chest pain about 2 weeks ago, sharp in nature, that has been pretty consistent, low pain, until the night prior to admit where it worsened in intensity. States she has had slight non productive cough, no fever, no chills, no night sweats, no N/V/D, no recent prison visits, denies IVDA, denied heavy alcohol use, denies recent travel outside of [place] in the past 6 months. Pt has had one sexual partner in the past 3 months, protected, and has not been tested for HIV prior to this. Denies recent travel into [place]. 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 Does smoke about 1/3 ppd for the last 7 yrs. Specifically, denies any arthralgias, hematuria, low back pain, epistaxis, hemoptysis, skin lesions or rash.   CT chest 3/19: 2. Bilateral areas of focal consolidation with a large area of cavitation in the largest right lower lobe consolidative area and small areas of cavitation in a left upper lobe consolidative area.   ANA 1: 40 speckled, ANCA neg, ace level 39 , RA factor and sed rate 39 Quantiferon Gold not resulted (indeterminate)  HIV -NR   02/01/2014 Follow up  Pt returns for 2 week follow up after bx  She underwent CT bx on 3/30 , path showed necrotizing granulomatous inflammation.  Denies known beryllium exposure at work .  Pt is accompanied by mother.  We discussed her dx/bx results.   has minimal cough. Still have rib pain.  Patient denies any fever or hemoptysis, orthopnea, PND, or leg swelling. She does smoke. We discussed smoking cessation.  Review of Systems  neg for any significant sore  throat, dysphagia, itching, sneezing, nasal congestion or excess/ purulent secretions, fever, chills, sweats, unintended wt loss, pleuritic or exertional cp, hempoptysis, orthopnea pnd or change in chronic leg swelling. Also denies presyncope, palpitations, heartburn, abdominal pain, nausea, vomiting, diarrhea or change in bowel or urinary habits, dysuria,hematuria, rash, arthralgias, visual complaints, headache, numbness weakness or ataxia.     Objective:   Physical Exam   Gen. Pleasant, well-nourished, in no distress, normal affect ENT - no lesions, no post nasal drip Neck: No JVD, no thyromegaly, no carotid bruits Lungs: no use of accessory muscles, no dullness to percussion, clear without rales or rhonchi  Cardiovascular: Rhythm regular, heart sounds  normal, no murmurs or gallops, no peripheral edema Abdomen: soft and non-tender, no hepatosplenomegaly, BS normal. Musculoskeletal: No deformities, no cyanosis or clubbing Neuro:  alert, non focal        Assessment & Plan:

## 2014-02-05 ENCOUNTER — Other Ambulatory Visit (HOSPITAL_COMMUNITY)
Admission: RE | Admit: 2014-02-05 | Discharge: 2014-02-05 | Disposition: A | Discharge status: Home / Self Care | Payer: BC Managed Care – PPO | Source: Ambulatory Visit | Attending: Family Medicine | Admitting: Family Medicine

## 2014-02-05 ENCOUNTER — Encounter: Payer: Self-pay | Admitting: Family Medicine

## 2014-02-05 ENCOUNTER — Ambulatory Visit (INDEPENDENT_AMBULATORY_CARE_PROVIDER_SITE_OTHER): Payer: BC Managed Care – PPO | Admitting: Family Medicine

## 2014-02-05 VITALS — BP 120/75 | HR 73 | Temp 98.7°F | Ht 64.0 in | Wt 102.0 lb

## 2014-02-05 DIAGNOSIS — J984 Other disorders of lung: Secondary | ICD-10-CM

## 2014-02-05 DIAGNOSIS — Z01419 Encounter for gynecological examination (general) (routine) without abnormal findings: Secondary | ICD-10-CM | POA: Insufficient documentation

## 2014-02-05 DIAGNOSIS — Z0000 Encounter for general adult medical examination without abnormal findings: Secondary | ICD-10-CM

## 2014-02-05 DIAGNOSIS — Z124 Encounter for screening for malignant neoplasm of cervix: Secondary | ICD-10-CM

## 2014-02-05 DIAGNOSIS — Z113 Encounter for screening for infections with a predominantly sexual mode of transmission: Secondary | ICD-10-CM | POA: Insufficient documentation

## 2014-02-05 DIAGNOSIS — R071 Chest pain on breathing: Secondary | ICD-10-CM

## 2014-02-05 DIAGNOSIS — R918 Other nonspecific abnormal finding of lung field: Secondary | ICD-10-CM

## 2014-02-05 LAB — POCT WET PREP (WET MOUNT): Clue Cells Wet Prep Whiff POC: POSITIVE

## 2014-02-05 LAB — RPR

## 2014-02-05 NOTE — Patient Instructions (Signed)
You had your pap smear in addition to testing for STDs (Gonorrhea, chlamydia, syphilis). If the results are normal you can expect a letter in the next 1-2 weeks, if something is abnormal then someone from clinic will call you.  If your pap smear is normal, your next one should be done in 3 years (April 2018).

## 2014-02-05 NOTE — Assessment & Plan Note (Signed)
Pap done today. STD testing also performed for GC/chlamydia, wet prep, RPR (HIV negative last month).

## 2014-02-05 NOTE — Progress Notes (Signed)
Patient ID: Ariel Henson, female   DOB: 1986-06-12, 28 y.o.   MRN: [id number]   Subjective:    Patient ID: Ariel Henson, female    DOB: 1986-06-12, 28 y.o.   MRN: [id number]  HPI  CC: pap smear, STD testing  Here for pap smear and STD testing. No complaints. Already had HIV tested at hospital visit March 2015, non-reactive.  Review of Systems   No vaginal discharge, no changes in weight, no difficulty breathing, no nausea/vomiting.  Objective:  BP 120/75  Pulse 73  Temp(Src) 98.7 F (37.1 C) (Oral)  Ht 5\' 4"  (1.626 m)  Wt 102 lb (46.267 kg)  BMI 17.50 kg/m2  LMP 01/11/2014  General: NAD GU: external genitalia normal, no discharge. Spec: NAVM, no discharge from cervical os, scant clear mucous. Bimanual: normal sized ovaries and uterus, no CMT.     Assessment & Plan:  See Problem List Documentation

## 2014-02-06 NOTE — Assessment & Plan Note (Addendum)
Multiple pulmonary cavitary lesions in the lungs on CT. Patient is status post CT guided biopsy. Pathology showed necrotizing granulomatous inflammation. Suspect this is underlying sarcoidosis. She does not have any known Beryllium exposure at work.  Case was discussed in detail with Dr. Rutherford LimerickAlva We'll begin steroid challenge Steroid discussion with patient given Patient will need to return for pulmonary  function test  Plan  Begin prednisone 20 mg daily for 2 weeks, then 10 mg daily -hold at this dose until seen back in office followup in 3 weeks with Dr. Vassie LollAlva and as needed Must quit smoking .  follow up Dr. Vassie LollAlva  In 4 weeks with PFT and chest xray

## 2014-02-07 LAB — CERVICOVAGINAL ANCILLARY ONLY
Chlamydia: NEGATIVE
Neisseria Gonorrhea: NEGATIVE

## 2014-02-25 ENCOUNTER — Encounter: Payer: Self-pay | Admitting: Family Medicine

## 2014-03-04 ENCOUNTER — Ambulatory Visit: Payer: BC Managed Care – PPO | Admitting: Pulmonary Disease

## 2014-03-05 LAB — AFB CULTURE WITH SMEAR (NOT AT ARMC): Acid Fast Smear: NONE SEEN

## 2014-03-06 ENCOUNTER — Ambulatory Visit: Payer: BC Managed Care – PPO | Admitting: Pulmonary Disease

## 2014-03-13 ENCOUNTER — Telehealth: Payer: Self-pay | Admitting: Pulmonary Disease

## 2014-03-13 NOTE — Telephone Encounter (Signed)
Needs Ov with CXR to assess

## 2014-03-13 NOTE — Telephone Encounter (Signed)
Spoke with the pt Pt had to change ov with PFT from 03/25/14 to 04/22/14  She is asking if she needs to continue pred for that long RA, please advise thanks

## 2014-03-14 MED ORDER — PREDNISONE 10 MG PO TABS: ORAL_TABLET | ORAL | Status: DC

## 2014-03-14 NOTE — Telephone Encounter (Signed)
Pt called back. Aware of recs. Per last OV w/ TP pt was to be on prednisone 10 mg until next OV. rx sent in

## 2014-03-14 NOTE — Telephone Encounter (Signed)
[id number] x1 

## 2014-03-25 ENCOUNTER — Ambulatory Visit: Payer: BC Managed Care – PPO | Admitting: Pulmonary Disease

## 2014-04-22 ENCOUNTER — Encounter: Payer: Self-pay | Admitting: *Deleted

## 2014-04-22 ENCOUNTER — Encounter: Payer: Self-pay | Admitting: Pulmonary Disease

## 2014-04-22 ENCOUNTER — Ambulatory Visit (INDEPENDENT_AMBULATORY_CARE_PROVIDER_SITE_OTHER): Payer: Managed Care, Other (non HMO) | Admitting: Pulmonary Disease

## 2014-04-22 ENCOUNTER — Ambulatory Visit (INDEPENDENT_AMBULATORY_CARE_PROVIDER_SITE_OTHER)
Admission: RE | Admit: 2014-04-22 | Discharge: 2014-04-22 | Disposition: A | Discharge status: Home / Self Care | Payer: Managed Care, Other (non HMO) | Source: Ambulatory Visit | Attending: Pulmonary Disease | Admitting: Pulmonary Disease

## 2014-04-22 VITALS — BP 102/60 | HR 103 | Ht 64.0 in | Wt 106.5 lb

## 2014-04-22 VITALS — BP 102/52 | HR 104 | Temp 98.6°F | Ht 64.0 in | Wt 106.5 lb

## 2014-04-22 DIAGNOSIS — D869 Sarcoidosis, unspecified: Secondary | ICD-10-CM

## 2014-04-22 DIAGNOSIS — J984 Other disorders of lung: Secondary | ICD-10-CM

## 2014-04-22 MED ORDER — ACETAMINOPHEN-CODEINE #3 300-30 MG PO TABS: 1.0000 | ORAL_TABLET | Freq: Three times a day (TID) | ORAL | Status: DC | PRN

## 2014-04-22 NOTE — Progress Notes (Deleted)
Chief Complaint  Patient presents with  . Follow-up    Review PFT. Pt c/o pain in back- mid left sided back x 1 day. Pt reports pain with breathing.    History of Present Illness: Marylee Belzer is a 28 y.o. female with cavitary lung lesions.   TESTS: CT chest 01/10/14 >> b/l consolidation with cavitation largest in RLL Labs 01/21/14 >> ANA 1:40, ANCA negative, ACE 39, ESR 39, HIV non reactive, Quantiferon gold indeterminate  Ercell Perlman  has a past medical history of Herpes.  Kenly Henckel  has no past surgical history on file.  Prior to Admission medications   Medication Sig Start Date End Date Taking? Authorizing Provider  predniSONE (DELTASONE) 10 MG tablet 1 tab daily and hold at this dose until back in office 03/14/14   Rigoberto Noel, MD    Allergies  Allergen Reactions  . Pollen Extract Other (See Comments)    Seasonal allergies - Runny nose     Physical Exam:  General - No distress ENT - No sinus tenderness, no oral exudate, no LAN Cardiac - s1s2 regular, no murmur Chest - No wheeze/rales/dullness Back - No focal tenderness Abd - Soft, non-tender Ext - No edema Neuro - Normal strength Skin - No rashes Psych - normal mood, and behavior   Assessment/Plan:  Chesley Mires, MD [place] [facility] Pager:  [phone]

## 2014-04-22 NOTE — Progress Notes (Signed)
PFT done today. 

## 2014-04-22 NOTE — Patient Instructions (Signed)
Decrease to 5 mg prednisone daily On Aug 1, drop to 5mg  M/W/F STOP on sep 1 st

## 2014-04-22 NOTE — Assessment & Plan Note (Addendum)
Decrease to 5 mg prednisone daily On Aug 1, drop to 5mg  M/W/F STOP on sep 1 st Since lung function is preserved , would taper steroids to off and monitor symptoms and chest x-ray

## 2014-04-22 NOTE — Progress Notes (Signed)
   Subjective:    Patient ID: Ariel Henson, female    DOB: 19-Dec-1985, 28 y.o.   MRN: [id number]  HPI  28 y.o. 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 Denies known beryllium exposure at work .  Does smoke about 1/3 ppd for the last 7 yrs.   Significant tests/ events  CT chest 01/10/14: 2. Bilateral areas of focal consolidation with a large area of cavitation in the largest right lower lobe consolidative area and small areas of cavitation in a left upper lobe consolidative area.  ANA 1: 40 speckled, ANCA neg, ace level 39 , RA factor and sed rate 39  Quantiferon Gold not resulted (indeterminate)  HIV -NR   CT bx on 01/21/14 , path showed necrotizing granulomatous inflammation, afb cx neg.     04/22/2014  Chief Complaint  Patient presents with  . Follow-up    Review PFT. Pt c/o pain in back- mid left sided back x 1 day. Pt reports pain with breathing   Started on prednisone 01/2014 - down to 10 mg now Smokes 3-4 cigs/d Patient denies any fever or hemoptysis, orthopnea, PND, or leg swelling.  PFTs  (on 10 mg pred)- no restrictoin, DLCO nml  CXR - mild improved in lung masses, no cavity appreciated  Review of Systems neg for any significant sore throat, dysphagia, itching, sneezing, nasal congestion or excess/ purulent secretions, fever, chills, sweats, unintended wt loss, pleuritic or exertional cp, hempoptysis, orthopnea pnd or change in chronic leg swelling. Also denies presyncope, palpitations, heartburn, abdominal pain, nausea, vomiting, diarrhea or change in bowel or urinary habits, dysuria,hematuria, rash, arthralgias, visual complaints, headache, numbness weakness or ataxia.     Objective:   Physical Exam  Gen. Pleasant, well-nourished, in no distress ENT - no lesions, no post nasal drip Neck: No JVD, no thyromegaly, no carotid  bruits Lungs: no use of accessory muscles, no dullness to percussion, clear without rales or rhonchi  Cardiovascular: Rhythm regular, heart sounds  normal, no murmurs or gallops, no peripheral edema Musculoskeletal: No deformities, no cyanosis or clubbing        Assessment & Plan:

## 2014-04-25 NOTE — Progress Notes (Signed)
Pt rescheduled to be seen by Dr. Vassie LollAlva.

## 2014-05-01 ENCOUNTER — Ambulatory Visit: Payer: Managed Care, Other (non HMO) | Admitting: Family Medicine

## 2014-05-22 LAB — PULMONARY FUNCTION TEST
DL/VA % pred: 122 %
DL/VA: 5.86 ml/min/mmHg/L
DLCO unc % pred: 101 %
DLCO unc: 24.73 ml/min/mmHg
FEF 25-75 POST: 3.45 L/s
FEF 25-75 PRE: 2.41 L/s
FEF2575-%Change-Post: 43 %
FEF2575-%PRED-POST: 102 %
FEF2575-%Pred-Pre: 71 %
FEV1-%Change-Post: 7 %
FEV1-%Pred-Post: 99 %
FEV1-%Pred-Pre: 92 %
FEV1-Post: 2.81 L
FEV1-Pre: 2.6 L
FEV1FVC-%Change-Post: 7 %
FEV1FVC-%Pred-Pre: 94 %
FEV6-%CHANGE-POST: 0 %
FEV6-%PRED-PRE: 99 %
FEV6-%Pred-Post: 100 %
FEV6-POST: 3.22 L
FEV6-Pre: 3.21 L
FEV6FVC-%PRED-POST: 101 %
FEV6FVC-%Pred-Pre: 101 %
FVC-%CHANGE-POST: 0 %
FVC-%PRED-POST: 99 %
FVC-%PRED-PRE: 98 %
FVC-PRE: 3.21 L
FVC-Post: 3.22 L
PRE FEV6/FVC RATIO: 100 %
Post FEV1/FVC ratio: 87 %
Post FEV6/FVC ratio: 100 %
Pre FEV1/FVC ratio: 81 %
RV % pred: 96 %
RV: 1.26 L
TLC % pred: 85 %
TLC: 4.3 L

## 2014-07-09 ENCOUNTER — Ambulatory Visit (INDEPENDENT_AMBULATORY_CARE_PROVIDER_SITE_OTHER): Payer: Managed Care, Other (non HMO) | Admitting: Family Medicine

## 2014-07-09 ENCOUNTER — Ambulatory Visit
Admission: RE | Admit: 2014-07-09 | Discharge: 2014-07-09 | Disposition: A | Discharge status: Home / Self Care | Payer: Managed Care, Other (non HMO) | Source: Ambulatory Visit | Attending: Family Medicine | Admitting: Family Medicine

## 2014-07-09 ENCOUNTER — Ambulatory Visit: Admission: RE | Admit: 2014-07-09 | Payer: Managed Care, Other (non HMO) | Source: Ambulatory Visit

## 2014-07-09 ENCOUNTER — Encounter: Payer: Self-pay | Admitting: Family Medicine

## 2014-07-09 VITALS — BP 116/82 | HR 86 | Temp 97.6°F | Wt 105.0 lb

## 2014-07-09 DIAGNOSIS — R05 Cough: Secondary | ICD-10-CM | POA: Insufficient documentation

## 2014-07-09 DIAGNOSIS — R059 Cough, unspecified: Secondary | ICD-10-CM

## 2014-07-09 MED ORDER — FLUTICASONE PROPIONATE 50 MCG/ACT NA SUSP: 2.0000 | Freq: Every day | NASAL | Status: DC

## 2014-07-09 MED ORDER — HYDROCOD POLST-CHLORPHEN POLST 10-8 MG/5ML PO LQCR: 5.0000 mL | Freq: Two times a day (BID) | ORAL | Status: DC | PRN

## 2014-07-09 NOTE — Progress Notes (Signed)
Patient ID: Markeria Goetsch, female   DOB: 1986/02/29, 28 y.o.   MRN: [id number] Subjective:   CC: Cough  HPI:   28 y.o. female with allergy to pollen and h/o sarcoidosis seen by [facility] here with cough for 5 days, yellow phlegm. Congestion and sinus congestion but no fever. She has also had body aches, one episode of diarrhea, difficulty sleeping, feeling "stopped up", pressure behind eyes and forehead, rhinorrhea, and sneezing. She denies fevers, chills, abdominal pain, nausea, vomiting, rash, joint swelling, chest pain, and dyspnea. She denies sick contacts. She tried a sinus congestion and pain medication and nyquil, neither of which helped. She has normal PO and is staying hydrated.  Recent imaging with h/o sarcoidosis:  Chest CT 01/10/14 with bilateral focal consolidation with large cavitation in largest right LL and small areas and small cavitation in left upper lobe consolidative area, quant gold indeterminate, HIV neg. CT bx on 01/21/14 , path showed necrotizing granulomatous inflammation, afb cx neg.   6/29 CXR: IMPRESSION:  Rounded areas of consolidation in the lungs bilaterally, with  possible slight interval improvement in the right perihilar region  and left lung base, indicating response to prednisone therapy in  this patient with sarcoid.   Review of Systems - Per HPI.   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  Smoking status: 1/3 PPD last 7 years.    Objective:  Physical Exam BP 116/82  Pulse 86  Temp(Src) 97.6 F (36.4 C) (Oral)  Wt 105 lb (47.628 kg)  LMP 07/02/2014 GEN: NAD CV: RRR, no m/r/g PULM: Coarse breath sounds, occasional wet-sounding cough, normal effort HEENT: AT/[place], sclera clear, PERRL, EOMI, o/p mildly erythematous with no exudate and with MMM, TMs mildly retracted with clear fluid, no sinus tenderness but reproduced pressure on palpation, neck supple, no  LAD, voice nasal sounding EXTR: No LE edema or calf tenderness ABD: S/NT/ND    Assessment:     Autie Vasudevan is a 28 y.o. female with h/o recently diagnosed sarcoidosis here for cough and congestion.    Plan:     Cough and congestion Likely viral sinusitis vs URI. AFVSS in clinic.  - Given sarcoidosis, will also check CXR today. Lung sounds coarse on exam. - Tussionex rx for cough - Encouraged hydration, rest, and honey as well. - Work note provided for today - F/u in ~5 days. - Flu shot on return - Strongly encouraged always wearing mask at work if dealing with chemical fumes or dust particles.  Follow-up: Follow up in ~5 days.  Precepted with Dr Randolm Idol.  Leona Singleton, MD [facility]

## 2014-07-09 NOTE — Assessment & Plan Note (Signed)
Likely viral sinusitis vs URI. AFVSS in clinic.  - Given sarcoidosis, will also check CXR today. Lung sounds coarse on exam. - Tussionex rx for cough - Encouraged hydration, rest, and honey as well. - Work note provided for today - F/u in ~5 days. - Flu shot on return - Strongly encouraged always wearing mask at work if dealing with chemical fumes or dust particles.

## 2014-07-09 NOTE — Patient Instructions (Addendum)
This looks like a viral illness, either upper respiratory or sinusitis or both. Get plenty of rest, drink plenty of warm fluids, and use nasal saline to help with nasal congestion.  There is not a good cough syrup. You should however try flonase as this may help with some of the nasal congestion. Use daily. Get the chest xray. I will call if this is WORSE than your prior imaging. Seek immediate care if you have chest pain, trouble breathing, fevers, or worsening symptoms or concerns. Otherwise, make follow up on Friday to recheck you. Wear mask daily at work.  Best,  Leona Singleton, MD

## 2014-07-10 ENCOUNTER — Telehealth: Payer: Self-pay | Admitting: Family Medicine

## 2014-07-10 NOTE — Telephone Encounter (Addendum)
Called to let patient know that chest x-ray yesterday did not show any new areas of consolidation. She should still plan to follow up as discussed and get plenty of rest and hydration. She voiced understanding.  Leona Singleton, MD

## 2014-07-15 ENCOUNTER — Ambulatory Visit: Payer: Managed Care, Other (non HMO) | Admitting: Family Medicine

## 2014-07-22 ENCOUNTER — Encounter: Payer: Self-pay | Admitting: Family Medicine

## 2014-07-22 ENCOUNTER — Ambulatory Visit (INDEPENDENT_AMBULATORY_CARE_PROVIDER_SITE_OTHER): Payer: Managed Care, Other (non HMO) | Admitting: Family Medicine

## 2014-07-22 VITALS — BP 102/60 | Temp 98.7°F | Wt 105.5 lb

## 2014-07-22 DIAGNOSIS — R059 Cough, unspecified: Secondary | ICD-10-CM

## 2014-07-22 DIAGNOSIS — R05 Cough: Secondary | ICD-10-CM

## 2014-07-22 MED ORDER — HYDROCOD POLST-CHLORPHEN POLST 10-8 MG/5ML PO LQCR: 5.0000 mL | Freq: Two times a day (BID) | ORAL | Status: DC | PRN

## 2014-07-22 MED ORDER — CETIRIZINE HCL 10 MG PO TABS: 10.0000 mg | ORAL_TABLET | Freq: Every day | ORAL | Status: DC

## 2014-07-22 MED ORDER — DM-GUAIFENESIN ER 30-600 MG PO TB12: 1.0000 | ORAL_TABLET | Freq: Two times a day (BID) | ORAL | Status: DC

## 2014-07-22 MED ORDER — BENZONATATE 100 MG PO CAPS: 100.0000 mg | ORAL_CAPSULE | Freq: Two times a day (BID) | ORAL | Status: DC | PRN

## 2014-07-22 MED ORDER — ACETAMINOPHEN-CODEINE #3 300-30 MG PO TABS: 1.0000 | ORAL_TABLET | Freq: Three times a day (TID) | ORAL | Status: DC | PRN

## 2014-07-22 NOTE — Assessment & Plan Note (Signed)
Improved Likely related to viral URI Will add mucinex to cough syrup regimen Cont flonase (given persistence of bilat inflammed turbinates) Potentially needs another steroid burst Will defer to pulm at this time Instructed pt to keep apptmt wed Needs to quit smoking, discussed with pt

## 2014-07-22 NOTE — Progress Notes (Signed)
Patient ID: Ariel Henson, female   DOB: December 07, 1985, 28 y.o.   MRN: [id number]   [facility] Charlane Ferretti, MD Phone: [phone]  Subjective:  Ms Ariel Henson is a 28 y.o F who presents today for f/up  # cough -seen by Dr. Karie Schwalbe on 07/09/14 who was considering viral URI vs sinusitis but given hx of sarcoid ordered cxr -feels that things are somewhat improved, but still having cough with mucus production- mucus is dark brown/yellow in coloration -currently taking flonase (has helped with congestion) and cough syrup -no fevers  -has appointment on Wednesday at 12pm with Dr. Vassie Loll  CXR 07/09/14 IMPRESSION:  Similar size, number, and distribution of bilateral lung nodules in  this patient with a documented history of sarcoidosis. No new region  of confluent airspace disease is identified.   All relevant systems were reviewed and were negative unless otherwise noted in the HPI  Past Medical History Reviewed problem list.  Medications- reviewed and updated Current Outpatient Prescriptions  Medication Sig Dispense Refill  . acetaminophen-codeine (TYLENOL #3) 300-30 MG per tablet Take 1 tablet by mouth every 8 (eight) hours as needed for moderate pain.  14 tablet  0  . chlorpheniramine-HYDROcodone (TUSSIONEX) 10-8 MG/5ML LQCR Take 5 mLs by mouth every 12 (twelve) hours as needed for cough (cough, will cause drowsiness.).  30 mL  0  . fluticasone (FLONASE) 50 MCG/ACT nasal spray Place 2 sprays into both nostrils daily.  16 g  1   No current facility-administered medications for this visit.   Chief complaint-noted No additions to family history Social history- patient is a current every day smoker- smokes 3 cigarettes a day  Objective: BP 102/60  Temp(Src) 98.7 F (37.1 C) (Oral)  Wt 105 lb 8 oz (47.854 kg)  LMP 07/02/2014 Gen: NAD, alert, cooperative with exam HEENT: NCAT, EOMI, bilat inflammed nasal turbs; pharyngeal palate with petechiae;TMs clear Neck: FROM,  supple CV: RRR, good S1/S2, no murmur, cap refill <3 Resp:decreased breath sounds; no wheezes appreciated  Skin: no rashes no lesions  Assessment/Plan: See problem based a/p

## 2014-07-22 NOTE — Patient Instructions (Addendum)
Ariel Henson it was great to meet you today!  I am sorry that you are not feeling well.  Please continue to use flonase Use the tussionex for coughing  Additionally you can use mucinex if not improved as needed for the mucus  Keep f/up with pulmonology  Ask about long term controller medications Please return to clinic if symptoms do not improve or worsen Feel better soon Charlane Ferretti, MD

## 2014-07-24 ENCOUNTER — Encounter: Payer: Self-pay | Admitting: Adult Health

## 2014-07-24 ENCOUNTER — Ambulatory Visit (INDEPENDENT_AMBULATORY_CARE_PROVIDER_SITE_OTHER): Payer: Managed Care, Other (non HMO) | Admitting: Adult Health

## 2014-07-24 VITALS — BP 100/64 | HR 96 | Temp 98.5°F | Ht 64.0 in | Wt 105.0 lb

## 2014-07-24 DIAGNOSIS — D869 Sarcoidosis, unspecified: Secondary | ICD-10-CM

## 2014-07-24 NOTE — Patient Instructions (Signed)
Must quit smoking .  Make sure to get yearly eye exams.  Follow up Dr. Vassie LollAlva  In 3-4 months and As needed

## 2014-07-24 NOTE — Progress Notes (Signed)
   Subjective:    Patient ID: Samul DadaOdette Elsen, female    DOB: 1985/12/16, 28 y.o.   MRN: [id number]  HPI   28 y.o. 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 Denies known beryllium exposure at work .  Does smoke about 1/3 ppd for the last 7 yrs.   Significant tests/ events  CT chest 01/10/14: 2. Bilateral areas of focal consolidation with a large area of cavitation in the largest right lower lobe consolidative area and small areas of cavitation in a left upper lobe consolidative area.  ANA 1: 40 speckled, ANCA neg, ace level 39 , RA factor and sed rate 39  Quantiferon Gold not resulted (indeterminate)  HIV -NR   CT bx on 01/21/14 , path showed necrotizing granulomatous inflammation, afb cx neg.     04/22/14 Started on prednisone 01/2014 - down to 10 mg now Smokes 3-4 cigs/d Patient denies any fever or hemoptysis, orthopnea, PND, or leg swelling.  PFTs  (on 10 mg pred)- no restrictoin, DLCO nml CXR - mild improved in lung masses, no cavity appreciated  07/24/2014 Follow up Sarcoid  Returns for 3 month follow up.  Reports breathing is doing well since last ov.   Did develop a prod cough that PCP treated, is now improved but not baseline.   Denies any f/c/s, n/v/d, hemoptysis, leg swelling Continues to smoke, advised on smoking cessation .  cxr on 9/15 w/ no sign change in lung nodules .  Active in school and work.  Has tapered off Steroids since 9/1 .     Review of Systems  neg for any significant sore throat, dysphagia, itching, sneezing, nasal congestion or excess/ purulent secretions, fever, chills, sweats, unintended wt loss, pleuritic or exertional cp, hempoptysis, orthopnea pnd or change in chronic leg swelling. Also denies presyncope, palpitations, heartburn, abdominal pain, nausea, vomiting, diarrhea or change in bowel or urinary habits,  dysuria,hematuria, rash, arthralgias, visual complaints, headache, numbness weakness or ataxia.     Objective:   Physical Exam   Gen. Pleasant, well-nourished, in no distress ENT - no lesions, no post nasal drip Neck: No JVD, no thyromegaly, no carotid bruits Lungs: no use of accessory muscles, no dullness to percussion, clear without rales or rhonchi  Cardiovascular: Rhythm regular, heart sounds  normal, no murmurs or gallops, no peripheral edema Musculoskeletal: No deformities, no cyanosis or clubbing        Assessment & Plan:

## 2014-07-24 NOTE — Assessment & Plan Note (Signed)
No flare off steroids  Continue on current regimen   Plan  Must quit smoking .  Make sure to get yearly eye exams.  Follow up Dr. Vassie LollAlva  In 3-4 months and As needed

## 2014-12-26 ENCOUNTER — Telehealth: Payer: Self-pay | Admitting: Adult Health

## 2014-12-26 ENCOUNTER — Encounter: Payer: Self-pay | Admitting: Adult Health

## 2014-12-26 ENCOUNTER — Ambulatory Visit (INDEPENDENT_AMBULATORY_CARE_PROVIDER_SITE_OTHER): Payer: Managed Care, Other (non HMO) | Admitting: Adult Health

## 2014-12-26 ENCOUNTER — Ambulatory Visit (INDEPENDENT_AMBULATORY_CARE_PROVIDER_SITE_OTHER)
Admission: RE | Admit: 2014-12-26 | Discharge: 2014-12-26 | Disposition: A | Discharge status: Home / Self Care | Payer: Managed Care, Other (non HMO) | Source: Ambulatory Visit | Attending: Adult Health | Admitting: Adult Health

## 2014-12-26 VITALS — BP 104/78 | HR 81 | Temp 98.3°F | Ht 63.0 in | Wt 102.8 lb

## 2014-12-26 DIAGNOSIS — D869 Sarcoidosis, unspecified: Secondary | ICD-10-CM

## 2014-12-26 MED ORDER — PREDNISONE 10 MG PO TABS: ORAL_TABLET | ORAL | Status: DC

## 2014-12-26 NOTE — Progress Notes (Signed)
   Subjective:    Patient ID: Ariel Henson, female    DOB: [birth year], 29 y.o.   MRN: [id number]  HPI   29 y.o. 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 Denies known beryllium exposure at work .  Does smoke about 1/3 ppd for the last 7 yrs.   Significant tests/ events  CT chest 01/10/14: 2. Bilateral areas of focal consolidation with a large area of cavitation in the largest right lower lobe consolidative area and small areas of cavitation in a left upper lobe consolidative area.  ANA 1: 40 speckled, ANCA neg, ace level 39 , RA factor and sed rate 39  Quantiferon Gold not resulted (indeterminate)  HIV -NR   CT bx on 01/21/14 , path showed necrotizing granulomatous inflammation, afb cx neg.     04/22/14 Started on prednisone 01/2014 - down to 10 mg now Smokes 3-4 cigs/d Patient denies any fever or hemoptysis, orthopnea, PND, or leg swelling.  PFTs  (on 10 mg pred)- no restrictoin, DLCO nml CXR - mild improved in lung masses, no cavity appreciated  9.30.15  Follow up Sarcoid  Returns for 3 month follow up.  Reports breathing is doing well since last ov.   Did develop a prod cough that PCP treated, is now improved but not baseline.   Denies any f/c/s, n/v/d, hemoptysis, leg swelling Continues to smoke, advised on smoking cessation .  cxr on 9/15 w/ no sign change in lung nodules .  Active in school and work.  Has tapered off Steroids since 9/1 .   12/26/2014 Follow up Sarcoid  Returns for 3 month follow up .  Complains of pain upper back, productive cough with thick mucus for last couple weeks .  No fever, discolored mucus . No joint swelling , rash , or visual changes.  Denies f/c/s, leg swelling, wheezing, chest pain or tightness, hemoptysis  No OTC used use for cough. Chest x-ray in September 2015 showed stable bilateral lung nodules  without any significant change Patient continues to smoke. We discussed smoking cessation    Review of Systems  neg for any significant sore throat, dysphagia, itching, sneezing, nasal congestion or excess/ purulent secretions, fever, chills, sweats, unintended wt loss, pleuritic or exertional cp, hempoptysis, orthopnea pnd or change in chronic leg swelling. Also denies presyncope, palpitations, heartburn, abdominal pain, nausea, vomiting, diarrhea or change in bowel or urinary habits, dysuria,hematuria, rash, arthralgias, visual complaints, headache, numbness weakness or ataxia.     Objective:   Physical Exam   Gen. Pleasant, well-nourished, in no distress ENT - no lesions, no post nasal drip Neck: No JVD, no thyromegaly, no carotid bruits Lungs: no use of accessory muscles, no dullness to percussion, clear without rales or rhonchi  Cardiovascular: Rhythm regular, heart sounds  normal, no murmurs or gallops, no peripheral edema Musculoskeletal: No deformities, no cyanosis or clubbing        Assessment & Plan:

## 2014-12-26 NOTE — Telephone Encounter (Signed)
Discussed with TP: Sorry, we cannot do pain meds for this issue.  Prednisone was given at the visit.  If the back pain persists, she needs to contact her PCP.  Thanks.

## 2014-12-26 NOTE — Assessment & Plan Note (Signed)
Flare with cough  Check cxr today  Prev PFT showed no restriction   Plan  Chest xray today .  Prednisone taper as directed.  Must quit smoking .  Delsym 2 tsp every 12hrs as needed cough  Make sure to get yearly eye exams.  Follow up Dr. Vassie LollAlva  In 2 months and As needed

## 2014-12-26 NOTE — Telephone Encounter (Signed)
Spoke with the pt and notified of recs per TP  She verbalized understanding Nothing further needed 

## 2014-12-26 NOTE — Telephone Encounter (Signed)
Called pt. She reports she spoke with TP about back pain and wants to know if something can be called in. Please advise TP thanks  Allergies  Allergen Reactions  . Pollen Extract Other (See Comments)    Seasonal allergies - Runny nose

## 2014-12-26 NOTE — Patient Instructions (Addendum)
Chest xray today .  Prednisone taper as directed.  Must quit smoking .  Delsym 2 tsp every 12hrs as needed cough  Make sure to get yearly eye exams.  Follow up Dr. Vassie LollAlva  In 2 months and As needed

## 2014-12-26 NOTE — Addendum Note (Signed)
Addended by: Boone MasterJONES, JESSICA E on: 12/26/2014 03:26 PM   Modules accepted: Orders

## 2014-12-27 ENCOUNTER — Telehealth: Payer: Self-pay | Admitting: Pulmonary Disease

## 2014-12-27 MED ORDER — ACETAMINOPHEN-CODEINE #3 300-30 MG PO TABS: 1.0000 | ORAL_TABLET | Freq: Three times a day (TID) | ORAL | Status: DC | PRN

## 2014-12-27 NOTE — Progress Notes (Signed)
Quick Note:  Patient notified. Verbalized understanding. Nothing further needed.   ______

## 2014-12-27 NOTE — Telephone Encounter (Signed)
Spoke with pt. States that RA has given her Tylenol #3 in the past for pain. Would like to have this filled. Advised her that [place]. Wants this message to go back to [place] to see if she has reconsidered giving her pain meds.  Spoke with TP. Give Tylenol #3 1 po q 8hrs prn #10. Rx has been called in. Pt is aware.

## 2015-01-16 ENCOUNTER — Telehealth: Payer: Self-pay | Admitting: Pulmonary Disease

## 2015-01-16 NOTE — Telephone Encounter (Signed)
Called spoke w/ pt. She saw TP 12/26/14. She was told f/u with RA w/ PFT. She wants to see RA in April. She refuses to see TP and states she does not help her. She is aware with the weekend coming up if she is having a lot of pain she needs to seek emergency care. Pt is still requesting we send message to RA to see if she can be worked into Dr. Reginia NaasAlva's schedule. Please advise thanks

## 2015-01-17 NOTE — Telephone Encounter (Signed)
Lets see if we can get her in sometime in april

## 2015-01-20 NOTE — Telephone Encounter (Signed)
ROV has been scheduled for 02/03/15 at 1:30pm. Nothing further was needed.

## 2015-02-03 ENCOUNTER — Encounter: Payer: Self-pay | Admitting: Pulmonary Disease

## 2015-02-03 ENCOUNTER — Encounter (INDEPENDENT_AMBULATORY_CARE_PROVIDER_SITE_OTHER): Payer: Self-pay

## 2015-02-03 ENCOUNTER — Ambulatory Visit (INDEPENDENT_AMBULATORY_CARE_PROVIDER_SITE_OTHER): Payer: Managed Care, Other (non HMO) | Admitting: Pulmonary Disease

## 2015-02-03 VITALS — BP 90/78 | HR 81 | Ht 64.0 in | Wt 103.4 lb

## 2015-02-03 DIAGNOSIS — D869 Sarcoidosis, unspecified: Secondary | ICD-10-CM

## 2015-02-03 MED ORDER — ACETAMINOPHEN-CODEINE #3 300-30 MG PO TABS: 1.0000 | ORAL_TABLET | Freq: Three times a day (TID) | ORAL | Status: DC | PRN

## 2015-02-03 NOTE — Assessment & Plan Note (Addendum)
Again the differential diagnosis here includes sarcoidosis most likely,Possibly berylliosis-but the history of exposure is not very clear. She was upset and tearful, when I explained the chest pain is not a typical symptom of sarcoidosis and therefore we must look for alternative explanations. It is possible that there is some pleural involvement and this is causing some chest pain. She was also worried about the possibility of scarring from sarcoidosis   CT scan of chest Obtain full PFTs - if there is a drop in lung function, we will place her on steroid course for a longer duration. She states that her chest pain worsened when she took a short course of prednisone. [facility] referral for second opinion  Tylenol #3 q 8h prn pain # 40 You have to QUIT smoking !!  FMLA papers will be filled out Imaging studies were again reviewed. All questions posed by the patient and her mother were clarified in great detail.

## 2015-02-03 NOTE — Progress Notes (Signed)
   Subjective:    Patient ID: Ariel Henson, female    DOB: Jun 05, 1986, 29 y.o.   MRN: [id number]  HPI  29 y.o. 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 Denies known beryllium exposure at work .  About 5 pack years smoking  Significant tests/ events  CT chest 01/10/14: 2. Bilateral areas of focal consolidation with a large area of cavitation in the largest right lower lobe consolidative area and small areas of cavitation in a left upper lobe consolidative area.  ANA 1: 40 speckled, ANCA neg, ace level 39 , RA factor and sed rate 39  Quantiferon Gold - (indeterminate)  HIV -NR   CT bx on 01/21/14 , path showed necrotizing granulomatous inflammation, afb cx neg.  PFTs 03/2014  (on 10 mg pred)- no restrictoin, DLCO nml  Started on prednisone 01/2014 - tapered to off by 06/2014   02/03/2015  Chief Complaint  Patient presents with  . Follow-up    some days has pain, other days does not have pain, prednisone helped with cough, but not pain.     Accompanied by mother Complains of pain upper back, productive cough with thick mucus for 2-4 weeks . She then states that she has been having this pain for 3 months. This is similar to the pain that she had in 2015 at onset. No fever, discolored mucus . No joint swelling , rash , or visual changes.  Denies  leg swelling, wheezing, hemoptysis  Patient continues to smoke.Smokes 3-4 cigs/d  She was treated with a short prednisone taper on last visit on 12/26/14-but she states that this made her pain worse Chest x-ray 12/26/14 showed multifocal patchy opacities, unchanged from prior film in 06/2014 She has FMLA papers to be filled out -she has missed a couple of days at work due to this chest pain. Tylenol 3 provided some relief   Past Medical History  Diagnosis Date  . Herpes     Review of Systems neg for any  significant sore throat, dysphagia, itching, sneezing, nasal congestion or excess/ purulent secretions, fever, chills, sweats, unintended wt loss, pleuritic or exertional cp, hempoptysis, orthopnea pnd or change in chronic leg swelling. Also denies presyncope, palpitations, heartburn, abdominal pain, nausea, vomiting, diarrhea or change in bowel or urinary habits, dysuria,hematuria, rash, arthralgias, visual complaints, headache, numbness weakness or ataxia.     Objective:   Physical Exam  Gen. Pleasant, well-nourished, in no distress, normal affect ENT - no lesions, no post nasal drip Neck: No JVD, no thyromegaly, no carotid bruits Lungs: no use of accessory muscles, no dullness to percussion, clear without rales or rhonchi  Cardiovascular: Rhythm regular, heart sounds  normal, no murmurs or gallops, no peripheral edema Abdomen: soft and non-tender, no hepatosplenomegaly, BS normal. Musculoskeletal: No deformities, no cyanosis or clubbing Neuro:  alert, non focal       Assessment & Plan:

## 2015-02-03 NOTE — Patient Instructions (Signed)
CT scan of chest Breathing test [facility] referral Tylenol #3 q 8h prn pain # 40 You have to QUIT smoking !!

## 2015-02-05 ENCOUNTER — Telehealth: Payer: Self-pay | Admitting: Pulmonary Disease

## 2015-02-05 ENCOUNTER — Other Ambulatory Visit (INDEPENDENT_AMBULATORY_CARE_PROVIDER_SITE_OTHER): Payer: Managed Care, Other (non HMO)

## 2015-02-05 DIAGNOSIS — D869 Sarcoidosis, unspecified: Secondary | ICD-10-CM | POA: Diagnosis not present

## 2015-02-05 LAB — BASIC METABOLIC PANEL
BUN: 8 mg/dL (ref 6–23)
CHLORIDE: 103 meq/L (ref 96–112)
CO2: 27 mEq/L (ref 19–32)
Calcium: 9.2 mg/dL (ref 8.4–10.5)
Creatinine, Ser: 0.79 mg/dL (ref 0.40–1.20)
GFR: 112.35 mL/min (ref >60.00)
GLUCOSE: 79 mg/dL (ref 70–99)
Potassium: 4 mEq/L (ref 3.5–5.1)
Sodium: 137 mEq/L (ref 135–145)

## 2015-02-05 LAB — HCG, QUANTITATIVE, PREGNANCY: Quantitative HCG: 0.7 m[IU]/mL

## 2015-02-05 NOTE — Telephone Encounter (Signed)
Requested FMLA papers from [employer], Spoke with Luceal at ext [phone], she said that they will be ready tomorrow morning for Dr. Vassie LollAlva to sign.  Patient notified via voicemail.  Awaiting papers.

## 2015-02-06 LAB — ANGIOTENSIN CONVERTING ENZYME: ANGIOTENSIN-CONVERTING ENZYME: 60 U/L — AB (ref 8–52)

## 2015-02-06 NOTE — Telephone Encounter (Signed)
Received FMLA papers, will take to [employer] office to get RA to sign.

## 2015-02-07 NOTE — Telephone Encounter (Signed)
Papers have been signed and sent to [employer] to fax.  Patient notified. Nothing further needed.

## 2015-02-11 ENCOUNTER — Ambulatory Visit (INDEPENDENT_AMBULATORY_CARE_PROVIDER_SITE_OTHER)
Admission: RE | Admit: 2015-02-11 | Discharge: 2015-02-11 | Disposition: A | Discharge status: Home / Self Care | Payer: Managed Care, Other (non HMO) | Source: Ambulatory Visit | Attending: Pulmonary Disease | Admitting: Pulmonary Disease

## 2015-02-11 DIAGNOSIS — D869 Sarcoidosis, unspecified: Secondary | ICD-10-CM | POA: Diagnosis not present

## 2015-02-11 MED ORDER — IOHEXOL 300 MG/ML  SOLN
80.0000 mL | Freq: Once | INTRAMUSCULAR | Status: AC | PRN
  Administered 2015-02-11: 80 mL via INTRAVENOUS

## 2015-02-12 ENCOUNTER — Telehealth: Payer: Self-pay | Admitting: Pulmonary Disease

## 2015-02-12 NOTE — Telephone Encounter (Signed)
She needs sooner appt after PFTs -there is some scarring

## 2015-02-12 NOTE — Telephone Encounter (Signed)
Called and spoke to pt. Pt c/o pain on left side of torso, this is the same pain that was present during OV. Pt questioning if this pain is from sarcoidosis? On the CT scan did the nodules look inflamed or was there any scaring in her lungs? Pt has current appt with RA on 5/31. Offered a sooner appt with TP, pt refused. PFT scheduled for 02/13/2015.   RA please advise on pt's questions. Thanks.

## 2015-02-12 NOTE — Telephone Encounter (Signed)
Patient refusing to see TP, do you want me to double book you next week?

## 2015-02-13 ENCOUNTER — Ambulatory Visit (INDEPENDENT_AMBULATORY_CARE_PROVIDER_SITE_OTHER): Payer: Managed Care, Other (non HMO) | Admitting: Pulmonary Disease

## 2015-02-13 ENCOUNTER — Encounter: Payer: Self-pay | Admitting: *Deleted

## 2015-02-13 DIAGNOSIS — D869 Sarcoidosis, unspecified: Secondary | ICD-10-CM | POA: Diagnosis not present

## 2015-02-13 LAB — PULMONARY FUNCTION TEST
DL/VA % pred: 137 %
DL/VA: 6.6 ml/min/mmHg/L
DLCO UNC % PRED: 82 %
DLCO UNC: 19.96 ml/min/mmHg
FEF 25-75 POST: 2.31 L/s
FEF 25-75 Pre: 1.86 L/sec
FEF2575-%Change-Post: 24 %
FEF2575-%PRED-PRE: 55 %
FEF2575-%Pred-Post: 69 %
FEV1-%CHANGE-POST: 9 %
FEV1-%Pred-Post: 85 %
FEV1-%Pred-Pre: 78 %
FEV1-POST: 2.38 L
FEV1-Pre: 2.18 L
FEV1FVC-%Change-Post: 4 %
FEV1FVC-%Pred-Pre: 91 %
FEV6-%CHANGE-POST: 3 %
FEV6-%Pred-Post: 88 %
FEV6-%Pred-Pre: 86 %
FEV6-PRE: 2.76 L
FEV6-Post: 2.85 L
FEV6FVC-%Change-Post: -1 %
FEV6FVC-%PRED-POST: 99 %
FEV6FVC-%Pred-Pre: 101 %
FVC-%Change-Post: 4 %
FVC-%PRED-POST: 88 %
FVC-%Pred-Pre: 85 %
FVC-PRE: 2.76 L
FVC-Post: 2.88 L
POST FEV1/FVC RATIO: 83 %
POST FEV6/FVC RATIO: 99 %
PRE FEV6/FVC RATIO: 100 %
Pre FEV1/FVC ratio: 79 %
RV % PRED: 92 %
RV: 1.23 L
TLC % pred: 74 %
TLC: 3.75 L

## 2015-02-13 NOTE — Telephone Encounter (Signed)
Y - Monday ok

## 2015-02-13 NOTE — Progress Notes (Signed)
PFT done today. 

## 2015-02-13 NOTE — Telephone Encounter (Signed)
Patient scheduled to see Dr. Vassie LollAlva Monday 02/17/15. Nothing further needed.

## 2015-02-17 ENCOUNTER — Ambulatory Visit (INDEPENDENT_AMBULATORY_CARE_PROVIDER_SITE_OTHER): Payer: Managed Care, Other (non HMO) | Admitting: Pulmonary Disease

## 2015-02-17 ENCOUNTER — Encounter: Payer: Self-pay | Admitting: Pulmonary Disease

## 2015-02-17 VITALS — BP 114/62 | HR 64 | Ht 64.0 in | Wt 104.0 lb

## 2015-02-17 DIAGNOSIS — D869 Sarcoidosis, unspecified: Secondary | ICD-10-CM

## 2015-02-17 MED ORDER — PREDNISONE 10 MG PO TABS: 10.0000 mg | ORAL_TABLET | Freq: Every day | ORAL | Status: DC

## 2015-02-17 MED ORDER — CELECOXIB 100 MG PO CAPS: 100.0000 mg | ORAL_CAPSULE | Freq: Every day | ORAL | Status: DC

## 2015-02-17 NOTE — Patient Instructions (Signed)
We reviewed Ct scan & breathing test - Some areas look better, lymph glands have decreased in size Lung capacity has dropped by 10 % Start celebrex 100 mg daily as needed for pain Take prednisone 20 mg (10 mg tabs ) - daily with food Call for stomach burning or nausea - Ok to take pepcid OTC for this Take calcium tabs daily

## 2015-02-17 NOTE — Progress Notes (Signed)
   Subjective:    Patient ID: Ariel DadaOdette Eskelson, female    DOB: Jan 15, 1986, 29 y.o.   MRN: [id number]  HPI  29 y.o. female for FU of sarcoidosis Does work at [employer], since 2014. Denies known beryllium exposure at work .  About 5 pack years smoking Started on prednisone 01/2014 - tapered to off by 06/2014  Significant tests/ events  CT chest 01/10/14:  Bilateral areas of focal consolidation with a large area of cavitation in the largest right lower lobe consolidative area and small areas of cavitation in a left upper lobe consolidative area.  ANA 1: 40 speckled, ANCA neg, ace level 39 , RA factor and sed rate 39  Quantiferon Gold - (indeterminate)  HIV -NR   CT bx on 01/21/14 , path showed necrotizing granulomatous inflammation, afb cx neg.  PFTs 03/2014  (on 10 mg pred)- no restrictoin, DLCO nml PFTs 01/2015 - FVC dropped from 98% to 85%, TLC dropped from 85 to 74%, DLCO dropped from 101 to 82%  CT chest 02/11/15 bilateral areas of nodularity and parenchymal consolidation. Many of these have decreased in size. Increasing cavitation in the left upper lobe consolidated area which has slightly enlarged in size. Mediastinal lymph nodes decreased in size.   02/17/2015  Chief Complaint  Patient presents with  . Follow-up    review pft.  Denies any breathing complaints today.     Continues to Complain of pain upper back, productive cough with thick mucus for 2-4 weeks . She then states that she has been having this pain for 3 months. This is similar to the pain that she had in 2015 at onset. No fever, discolored mucus . No joint swelling , rash , or visual changes.  Denies  leg swelling, wheezing, hemoptysis  Patient continues to smoke.Smokes 3-4 cigs/d  FMLA papers were filled out  We reviewed CT chest and PFT findings as listed above   Review of Systems neg for any significant sore throat, dysphagia, itching, sneezing, nasal congestion or excess/ purulent secretions,  fever, chills, sweats, unintended wt loss, pleuritic or exertional cp, hempoptysis, orthopnea pnd or change in chronic leg swelling. Also denies presyncope, palpitations, heartburn, abdominal pain, nausea, vomiting, diarrhea or change in bowel or urinary habits, dysuria,hematuria, rash, arthralgias, visual complaints, headache, numbness weakness or ataxia.     Objective:   Physical Exam  Gen. Pleasant, well-nourished, in no distress ENT - no lesions, no post nasal drip Neck: No JVD, no thyromegaly, no carotid bruits Lungs: no use of accessory muscles, no dullness to percussion, clear without rales or rhonchi  Cardiovascular: Rhythm regular, heart sounds  normal, no murmurs or gallops, no peripheral edema Musculoskeletal: No deformities, no cyanosis or clubbing         Assessment & Plan:

## 2015-02-18 ENCOUNTER — Telehealth: Payer: Self-pay | Admitting: Pulmonary Disease

## 2015-02-18 ENCOUNTER — Other Ambulatory Visit: Payer: Managed Care, Other (non HMO)

## 2015-02-18 DIAGNOSIS — D869 Sarcoidosis, unspecified: Secondary | ICD-10-CM

## 2015-02-18 NOTE — Assessment & Plan Note (Signed)
We reviewed Ct scan & breathing test - Some areas look better, lymph glands have decreased in size Lung capacity has dropped by 10 % Start celebrex 100 mg daily as needed for pain Take prednisone 20 mg (10 mg tabs ) - daily with food Call for stomach burning or nausea - Ok to take pepcid OTC for this Take calcium tabs daily  I wonder if we have to consider alternative diagnosis-given cavitary nature of the nodules and chest pain-which would be atypical features for sarcoidosis. We'll test for ANCA

## 2015-02-18 NOTE — Telephone Encounter (Signed)
Patient notified.  Lab orders entered. Patient will come by and have drawn today.  Nothing further needed.

## 2015-02-18 NOTE — Telephone Encounter (Signed)
I would like her to get one more blood test done -ANA -ANCA  Please order - results [facility] be back [facility]

## 2015-02-19 LAB — ANA: Anti Nuclear Antibody(ANA): NEGATIVE

## 2015-02-19 LAB — ANCA SCREEN W REFLEX TITER
ATYPICAL P-ANCA SCREEN: NEGATIVE
P-ANCA SCREEN: NEGATIVE
c-ANCA Screen: NEGATIVE

## 2015-03-03 ENCOUNTER — Telehealth: Payer: Self-pay | Admitting: Pulmonary Disease

## 2015-03-03 NOTE — Telephone Encounter (Signed)
ATC pt. Line busy. WCB 

## 2015-03-04 NOTE — Telephone Encounter (Signed)
#  60 1 tablet by mouth every 12 hours when necessary

## 2015-03-04 NOTE — Telephone Encounter (Signed)
Called and spoke to pt. Pt stated the Celebrex is over $100 and she is unable to afford this. Pt stated the Tylenol #3 is only $22. Pt requesting recs from RA.   RA please advise. Thanks.

## 2015-03-04 NOTE — Telephone Encounter (Signed)
OK to take Tylenol #3 Can combine with Motrin 400 mg twice daily daily with food prn

## 2015-03-04 NOTE — Telephone Encounter (Signed)
Please advise quantity on tylenol # 3 and directions thanks

## 2015-03-05 MED ORDER — ACETAMINOPHEN-CODEINE #3 300-30 MG PO TABS: 1.0000 | ORAL_TABLET | Freq: Two times a day (BID) | ORAL | Status: DC | PRN

## 2015-03-05 NOTE — Telephone Encounter (Signed)
Spoke with pt, aware of recs.  Verified pharmacy.  Called med into pharmacy.  Nothing further needed.

## 2015-03-25 ENCOUNTER — Encounter: Payer: Self-pay | Admitting: Pulmonary Disease

## 2015-03-25 ENCOUNTER — Ambulatory Visit (INDEPENDENT_AMBULATORY_CARE_PROVIDER_SITE_OTHER): Payer: Managed Care, Other (non HMO) | Admitting: Pulmonary Disease

## 2015-03-25 VITALS — BP 88/64 | HR 105 | Ht 64.0 in | Wt 106.4 lb

## 2015-03-25 DIAGNOSIS — D869 Sarcoidosis, unspecified: Secondary | ICD-10-CM | POA: Diagnosis not present

## 2015-03-25 NOTE — Assessment & Plan Note (Signed)
CXR today Note for work After current Rx for tylenol#3 is done, trial extra strength tylenol every 6h as needed -alternating with aleve the next day Make appt with [facility] Drop prednisone to 15 mg in June 10mg  in July [place] appt in [place]

## 2015-03-25 NOTE — Progress Notes (Signed)
   Subjective:    Patient ID: Ariel Henson, female    DOB: 10/22/1986, 29 y.o.   MRN: [id number]  HPI  29 y.o. female for FU of sarcoidosis, presenting as multiple lung masses Does work at [employer], since 2014. Denies known beryllium exposure at work .  About 5 pack years smoking Started on prednisone 01/2014 - tapered to off by 06/2014    03/25/2015  Chief Complaint  Patient presents with  . Sarcoidosis    patient feeling fine, no complaints   Her left axillary back pain is much improved on a regimen of Tylenol 3 and occasional Aleve He could not afford Celebrex No fever, discolored mucus . No joint swelling , rash , or visual changes.  Denies  leg swelling, wheezing, hemoptysis  Patient continues to smoke.Smokes 3-4 cigs/d   Significant tests/ events  CT chest 01/10/14:  Bilateral areas of focal consolidation with a large area of cavitation in the largest right lower lobe consolidative area and small areas of cavitation in a left upper lobe consolidative area.  ANA 1: 40 speckled, ANCA neg, ace level 39 , RA factor and sed rate 39  Quantiferon Gold - (indeterminate)  HIV -NR   CT bx on 01/21/14 , path showed necrotizing granulomatous inflammation, afb cx neg.  PFTs 03/2014  (on 10 mg pred)- no restriction, DLCO nml PFTs 01/2015 - FVC dropped from 98% to 85%, TLC dropped from 85 to 74%, DLCO dropped from 101 to 82%  CT chest 02/11/15 bilateral areas of nodularity and parenchymal consolidation. Many of these have decreased in size. Increasing cavitation in the left upper lobe consolidated area which has slightly enlarged in size. Mediastinal lymph nodes decreased in size.  01/2015 FMLA papers were filled out   Review of Systems neg for any significant sore throat, dysphagia, itching, sneezing, nasal congestion or excess/ purulent secretions, fever, chills, sweats, unintended wt loss, pleuritic or exertional cp, hempoptysis, orthopnea pnd or change in chronic leg  swelling. Also denies presyncope, palpitations, heartburn, abdominal pain, nausea, vomiting, diarrhea or change in bowel or urinary habits, dysuria,hematuria, rash, arthralgias, visual complaints, headache, numbness weakness or ataxia.     Objective:   Physical Exam  Gen. Pleasant, well-nourished, in no distress ENT - no lesions, no post nasal drip Neck: No JVD, no thyromegaly, no carotid bruits Lungs: no use of accessory muscles, no dullness to percussion, clear without rales or rhonchi , point tenderness left axilla Cardiovascular: Rhythm regular, heart sounds  normal, no murmurs or gallops, no peripheral edema Musculoskeletal: No deformities, no cyanosis or clubbing        Assessment & Plan:

## 2015-03-25 NOTE — Patient Instructions (Addendum)
CXR today Note for work After current Rx for tylenol#3 is done, trial extra strength tylenol every 6h as needed -alternating with aleve the next day Make appt with [facility] Drop prednisone to 15 mg in June 10mg in July Make appt in [place] 

## 2015-03-28 ENCOUNTER — Ambulatory Visit (INDEPENDENT_AMBULATORY_CARE_PROVIDER_SITE_OTHER)
Admission: RE | Admit: 2015-03-28 | Discharge: 2015-03-28 | Disposition: A | Discharge status: Home / Self Care | Payer: Managed Care, Other (non HMO) | Source: Ambulatory Visit | Attending: Pulmonary Disease | Admitting: Pulmonary Disease

## 2015-03-28 DIAGNOSIS — D869 Sarcoidosis, unspecified: Secondary | ICD-10-CM | POA: Diagnosis not present

## 2015-04-16 ENCOUNTER — Telehealth: Payer: Self-pay | Admitting: Pulmonary Disease

## 2015-04-16 NOTE — Telephone Encounter (Signed)
lmtcb X1 for pt  

## 2015-04-17 NOTE — Telephone Encounter (Signed)
ATC pt received "the voicemail box has not been set up yet to try my call again later" [facility]

## 2015-04-18 ENCOUNTER — Telehealth: Payer: Self-pay | Admitting: Pulmonary Disease

## 2015-04-18 MED ORDER — PROMETHAZINE-CODEINE 6.25-10 MG/5ML PO SYRP: 2.5000 mL | ORAL_SOLUTION | Freq: Every evening | ORAL | Status: DC | PRN

## 2015-04-18 NOTE — Telephone Encounter (Signed)
Patient wants to know if she has mold in her lungs.  Patient says that she has lived in her home for 4 years and she has mold in her home, she has reported this to the landlord several times, but they never did anything about it.  She wants to know if there is a test that can be done to determine if she has mold in her lungs or if this is the cause of her sarcoidosis.  While talking to her on the phone, she began coughing non-stop and sounded miserable on the phone.  She says that she does not have an inhaler, no cough medication, etc.. She said that she thinks it is just her allergies, however, she couldn't catch her breath while talking to me on the phone, I advised her that I would ask Dr. Vassie Loll what he recommends to help her with her cough since she is having such a hard time.   RA - please advise  Allergies  Allergen Reactions  . Pollen Extract Other (See Comments)    Seasonal allergies - Runny nose

## 2015-04-18 NOTE — Telephone Encounter (Signed)
Patient says that over the counter cough medications do not work for her and she has tried Scientist, physiological and it is not working.  Wants to know if RA has any other recommendations.  RA - please advise.

## 2015-04-18 NOTE — Telephone Encounter (Signed)
atc pt X3, vm not set up.  Will close per triage protocol.

## 2015-04-18 NOTE — Telephone Encounter (Signed)
Patient notified. Rx called into pharmacy. Patient says that she has appointment with [facility] on 05/02/2015.  FYI - Dr. Vassie Loll

## 2015-04-18 NOTE — Telephone Encounter (Signed)
Fungal stains were negative on her biopsy specimen. Use Delsym for cough 77ml tid as needed Zyrtec daily for allergies

## 2015-04-18 NOTE — Telephone Encounter (Signed)
Can try codeine cough syrup - careful with driving -promethazine/codeine 2.5 - 76ml PO qhs # 120 ml Has she been seen at [facility] yet?

## 2015-05-04 ENCOUNTER — Emergency Department (INDEPENDENT_AMBULATORY_CARE_PROVIDER_SITE_OTHER)
Admission: EM | Admit: 2015-05-04 | Discharge: 2015-05-04 | Disposition: A | Discharge status: Home / Self Care | Payer: Managed Care, Other (non HMO) | Source: Home / Self Care | Attending: Emergency Medicine | Admitting: Emergency Medicine

## 2015-05-04 ENCOUNTER — Encounter (HOSPITAL_COMMUNITY): Payer: Self-pay | Admitting: Emergency Medicine

## 2015-05-04 DIAGNOSIS — J029 Acute pharyngitis, unspecified: Secondary | ICD-10-CM | POA: Diagnosis not present

## 2015-05-04 LAB — POCT RAPID STREP A: Streptococcus, Group A Screen (Direct): NEGATIVE

## 2015-05-04 MED ORDER — LIDOCAINE VISCOUS 2 % MT SOLN
5.0000 mL | OROMUCOSAL | Status: DC | PRN
Start: 2015-05-04 — End: 2015-07-15

## 2015-05-04 MED ORDER — OMEPRAZOLE 20 MG PO CPDR: 20.0000 mg | DELAYED_RELEASE_CAPSULE | Freq: Every day | ORAL | Status: DC

## 2015-05-04 NOTE — Discharge Instructions (Signed)
I think your sore throat is coming from reflux. Take omeprazole daily for the next 2 weeks to see if that will help. Swish and gargle 5 mL of viscous lidocaine then spit it out to temporarily numb your mouth and throat. Be careful with hot liquids and foods so you do not burn yourself. If no improvement in 2 weeks, please follow-up with ENT.

## 2015-05-04 NOTE — ED Notes (Signed)
C/o sore throat since July 6 Tylenol 3 used as tx

## 2015-05-04 NOTE — ED Provider Notes (Signed)
CSN: [id number]     Arrival date & time 05/04/15  1920 History   First MD Initiated Contact with Patient 05/04/15 2044     Chief Complaint  Patient presents with  . Sore Throat   (Consider location/radiation/quality/duration/timing/severity/associated sxs/prior Treatment) HPI  She is a 29 year old woman here for evaluation of sore throat. She states this started about 5 days ago. It is worse first thing in the morning. It does improve after taking a Tylenol with codeine. She denies any fevers, nasal congestion, rhinorrhea. She is getting over a cough. Pain is worse with swallowing. She is able to tolerate liquids and solids well. No vomiting. She does have sarcoidosis and is currently on prednisone.  Past Medical History  Diagnosis Date  . Herpes    History reviewed. No pertinent past surgical history. History reviewed. No pertinent family history. History  Substance Use Topics  . Smoking status: Current Every Day Smoker -- 0.03 packs/day for 7 years    Types: Cigarettes  . Smokeless tobacco: Never Used     Comment: pt using nicotine gum to try and stop  . Alcohol Use: No   OB History    No data available     Review of Systems As in history of present illness Allergies  Pollen extract  Home Medications   Prior to Admission medications   Medication Sig Start Date End Date Taking? Authorizing Provider  acetaminophen-codeine (TYLENOL #3) 300-30 MG per tablet Take 1 tablet by mouth 2 (two) times daily as needed for moderate pain. 03/05/15   Oretha Milchakesh Alva V, MD  lidocaine (XYLOCAINE) 2 % solution Use as directed 5 mLs in the mouth or throat every 4 (four) hours as needed for mouth pain. 05/04/15   Charm RingsErin J Licia Harl, MD  omeprazole (PRILOSEC) 20 MG capsule Take 1 capsule (20 mg total) by mouth daily. 05/04/15   Charm RingsErin J Apostolos Blagg, MD  predniSONE (DELTASONE) 10 MG tablet Take 1 tablet (10 mg total) by mouth daily with breakfast. Patient taking differently: Take 20 mg by mouth daily with breakfast.   02/17/15   Oretha Milchakesh Alva V, MD  promethazine-codeine (PHENERGAN WITH CODEINE) 6.25-10 MG/5ML syrup Take 2.5 mLs by mouth at bedtime as needed for cough. 04/18/15   Oretha Milchakesh Alva V, MD   BP 105/69 mmHg  Pulse 74  Temp(Src) 98.2 F (36.8 C) (Oral)  Resp 12  SpO2 99%  LMP 04/30/2015 Physical Exam  Constitutional: She is oriented to person, place, and time. She appears well-developed and well-nourished. No distress.  HENT:  Nose: Nose normal.  Mouth/Throat: No oropharyngeal exudate.  Posterior oropharynx is erythematous. No tonsillar swelling or exudate.  Eyes: Conjunctivae are normal.  Neck: Neck supple.  Cardiovascular: Normal rate, regular rhythm and normal heart sounds.   No murmur heard. Pulmonary/Chest: Effort normal. No respiratory distress. She has wheezes (rare wheeze). She has no rales.  Lymphadenopathy:    She has no cervical adenopathy.  Neurological: She is alert and oriented to person, place, and time.  Skin: Skin is warm and dry.    ED Course  Procedures (including critical care time) Labs Review Labs Reviewed  POCT RAPID STREP A    Imaging Review No results found.   MDM   1. Sore throat    Rapid strep negative. I suspect this is reflux related. Treat with omeprazole and viscous lidocaine. Follow-up with ENT if no improvement in 2 weeks.    Charm RingsErin J Kaesen Rodriguez, MD 05/04/15 [phone]

## 2015-05-05 ENCOUNTER — Telehealth: Payer: Self-pay | Admitting: Pulmonary Disease

## 2015-05-05 MED ORDER — PREDNISONE 10 MG PO TABS
10.0000 mg | ORAL_TABLET | Freq: Every day | ORAL | Status: DC
Start: 2015-05-05 — End: 2015-07-15

## 2015-05-05 NOTE — Telephone Encounter (Signed)
Spoke with patient, states that she has misplaced her Prednisone Rx this weekend. [facility] [address]  This has been sent. Nothing further needed.

## 2015-05-06 ENCOUNTER — Encounter: Payer: Self-pay | Admitting: Family Medicine

## 2015-05-06 ENCOUNTER — Ambulatory Visit (INDEPENDENT_AMBULATORY_CARE_PROVIDER_SITE_OTHER): Payer: Managed Care, Other (non HMO) | Admitting: Family Medicine

## 2015-05-06 VITALS — BP 102/68 | HR 95 | Temp 98.7°F | Ht 64.0 in | Wt 98.0 lb

## 2015-05-06 DIAGNOSIS — J069 Acute upper respiratory infection, unspecified: Secondary | ICD-10-CM

## 2015-05-06 MED ORDER — IBUPROFEN 600 MG PO TABS: 600.0000 mg | ORAL_TABLET | Freq: Three times a day (TID) | ORAL | Status: DC | PRN

## 2015-05-06 MED ORDER — PSEUDOEPHEDRINE HCL 30 MG PO TABS: 30.0000 mg | ORAL_TABLET | Freq: Four times a day (QID) | ORAL | Status: DC | PRN

## 2015-05-06 NOTE — Progress Notes (Signed)
Patient ID: Ariel Henson, female   DOB: 02-28-1986, 29 y.o.   MRN: [id number]   [facility] Yolande Jolly, MD Phone: [phone]  Subjective:  SORE THROAT  Sore throat began 7 days ago. Pain is: Moderate Severity: 6/10 Medications tried: Pain medicine, Viscous Lidocaine, Prilosec Strep throat exposure: No know. Negative for strep at Urgent care 2 days ago.  STD exposure: None   Symptoms Fever: None Cough: Mild / productive of clear - yellow sputum for 2 weeks.  Runny nose: Intermittent.  Muscle aches: None Swollen Glands: None Trouble breathing: None  Drooling: None.  Weight loss: None.   Patient believes could be caused by: Viral infection / congestion.   Review of Symptoms - see HPI PMH - Smoking status noted.    Hx of Sarcoid. Pt. On steroids. Provider previously thought it could be related to GERD. Sore throat is worse in the morning when she wakes up. She has had nasal congestion. The treatment for GERD / Viscous lidocaine have not helped. She is not having fever or worse symptoms. She has had sore throat and continued congestion. Does not necessarily feel postnasal drip.    All relevant systems were reviewed and were negative unless otherwise noted in the HPI  Past Medical History Reviewed problem list.  Medications- reviewed and updated Current Outpatient Prescriptions  Medication Sig Dispense Refill  . acetaminophen-codeine (TYLENOL #3) 300-30 MG per tablet Take 1 tablet by mouth 2 (two) times daily as needed for moderate pain. 60 tablet 0  . ibuprofen (ADVIL,MOTRIN) 600 MG tablet Take 1 tablet (600 mg total) by mouth every 8 (eight) hours as needed. 30 tablet 0  . lidocaine (XYLOCAINE) 2 % solution Use as directed 5 mLs in the mouth or throat every 4 (four) hours as needed for mouth pain. 100 mL 0  . omeprazole (PRILOSEC) 20 MG capsule Take 1 capsule (20 mg total) by mouth daily. 30 capsule 0  . predniSONE (DELTASONE) 10 MG tablet Take 1  tablet (10 mg total) by mouth daily with breakfast. 30 tablet 0  . promethazine-codeine (PHENERGAN WITH CODEINE) 6.25-10 MG/5ML syrup Take 2.5 mLs by mouth at bedtime as needed for cough. 120 mL 0  . pseudoephedrine (SUDAFED) 30 MG tablet Take 1 tablet (30 mg total) by mouth every 6 (six) hours as needed for congestion. 30 tablet 0   No current facility-administered medications for this visit.   Chief complaint-noted No additions to family history Social history- patient is a non smoker  Objective: BP 102/68 mmHg  Pulse 95  Temp(Src) 98.7 F (37.1 C) (Oral)  Ht  (1.626 m)  Wt 98 lb (44.453 kg)  BMI 16.81 kg/m2  SpO2 97%  LMP 04/30/2015 Gen: NAD, alert, cooperative with exam HEENT: NCAT, EOMI, PERRL, TMs with small amount of fluid behind them bilaterally. O/P with mild erythema posteriorly, Tonsils normal in size. No exudates.  Neck: FROM, supple, no thyromegaly or LAD.  CV: RRR, good S1/S2, no murmur Resp: CTABL, no wheezes, non-labored Abd: SNTND, BS present, no guarding or organomegaly Ext: No edema, warm, normal tone, moves UE/LE spontaneously Neuro: Alert and oriented, No gross deficits Skin: no rashes no lesions  Assessment/Plan:  # Congestion / Postnasal Drip - Pt. Here with ongoing congestion, cough, and throat pain worse in the morning. She has been treated for reflux without improvement in symptoms. LIkely all related to congestion and o/p irritation. No signs of bacterial infection at this time. She has been afebrile. No difficulty  breathing. No signs of pneumonia.  - Supportive care.  - Plenty of water - Ibuprofen - Sudafed for decongestion.  - Robitussin for cough.  - Return if symptoms begin to worsen or do not improve.  - may use honey / warm water - May also gargle salt water / use the viscous lidocaine as needed.

## 2015-05-06 NOTE — Patient Instructions (Signed)
Upper Respiratory Infection, Adult An upper respiratory infection (URI) is also sometimes known as the common cold. The upper respiratory tract includes the nose, sinuses, throat, trachea, and bronchi. Bronchi are the airways leading to the lungs. Most people improve within 1 week, but symptoms can last up to 2 weeks. A residual cough may last even longer.  CAUSES Many different viruses can infect the tissues lining the upper respiratory tract. The tissues become irritated and inflamed and often become very moist. Mucus production is also common. A cold is contagious. You can easily spread the virus to others by oral contact. This includes kissing, sharing a glass, coughing, or sneezing. Touching your mouth or nose and then touching a surface, which is then touched by another person, can also spread the virus. SYMPTOMS  Symptoms typically develop 1 to 3 days after you come in contact with a cold virus. Symptoms vary from person to person. They may include:  Runny nose.  Sneezing.  Nasal congestion.  Sinus irritation.  Sore throat.  Loss of voice (laryngitis).  Cough.  Fatigue.  Muscle aches.  Loss of appetite.  Headache.  Low-grade fever. DIAGNOSIS  You might diagnose your own cold based on familiar symptoms, since most people get a cold 2 to 3 times a year. Your caregiver can confirm this based on your exam. Most importantly, your caregiver can check that your symptoms are not due to another disease such as strep throat, sinusitis, pneumonia, asthma, or epiglottitis. Blood tests, throat tests, and X-rays are not necessary to diagnose a common cold, but they may sometimes be helpful in excluding other more serious diseases. Your caregiver will decide if any further tests are required. RISKS AND COMPLICATIONS  You may be at risk for a more severe case of the common cold if you smoke cigarettes, have chronic heart disease (such as heart failure) or lung disease (such as asthma), or if  you have a weakened immune system. The very young and very old are also at risk for more serious infections. Bacterial sinusitis, middle ear infections, and bacterial pneumonia can complicate the common cold. The common cold can worsen asthma and chronic obstructive pulmonary disease (COPD). Sometimes, these complications can require emergency medical care and may be life-threatening. PREVENTION  The best way to protect against getting a cold is to practice good hygiene. Avoid oral or hand contact with people with cold symptoms. Wash your hands often if contact occurs. There is no clear evidence that vitamin C, vitamin E, echinacea, or exercise reduces the chance of developing a cold. However, it is always recommended to get plenty of rest and practice good nutrition. TREATMENT  Treatment is directed at relieving symptoms. There is no cure. Antibiotics are not effective, because the infection is caused by a virus, not by bacteria. Treatment may include:  Increased fluid intake. Sports drinks offer valuable electrolytes, sugars, and fluids.  Breathing heated mist or steam (vaporizer or shower).  Eating chicken soup or other clear broths, and maintaining good nutrition.  Getting plenty of rest.  Using gargles or lozenges for comfort.  Controlling fevers with ibuprofen or acetaminophen as directed by your caregiver.  Increasing usage of your inhaler if you have asthma. Zinc gel and zinc lozenges, taken in the first 24 hours of the common cold, can shorten the duration and lessen the severity of symptoms. Pain medicines may help with fever, muscle aches, and throat pain. A variety of non-prescription medicines are available to treat congestion and runny nose. Your caregiver   can make recommendations and may suggest nasal or lung inhalers for other symptoms.  HOME CARE INSTRUCTIONS   Only take over-the-counter or prescription medicines for pain, discomfort, or fever as directed by your  caregiver.  Use a warm mist humidifier or inhale steam from a shower to increase air moisture. This may keep secretions moist and make it easier to breathe.  Drink enough water and fluids to keep your urine clear or pale yellow.  Rest as needed.  Return to work when your temperature has returned to normal or as your caregiver advises. You may need to stay home longer to avoid infecting others. You can also use a face mask and careful hand washing to prevent spread of the virus. SEEK MEDICAL CARE IF:   After the first few days, you feel you are getting worse rather than better.  You need your caregiver's advice about medicines to control symptoms.  You develop chills, worsening shortness of breath, or brown or red sputum. These may be signs of pneumonia.  You develop yellow or brown nasal discharge or pain in the face, especially when you bend forward. These may be signs of sinusitis.  You develop a fever, swollen neck glands, pain with swallowing, or white areas in the back of your throat. These may be signs of strep throat. SEEK IMMEDIATE MEDICAL CARE IF:   You have a fever.  You develop severe or persistent headache, ear pain, sinus pain, or chest pain.  You develop wheezing, a prolonged cough, cough up blood, or have a change in your usual mucus (if you have chronic lung disease).  You develop sore muscles or a stiff neck. Document Released: 04/06/2001 Document Revised: 01/03/2012 Document Reviewed: 01/16/2014 [facility] Patient Information 2015 [place]. This information is not intended to replace advice given to you by your health care provider. Make sure you discuss any questions you have with your health care provider.   Thanks for coming in today!   You should take motrin and sudafed to improve your symptoms. You may also take robitussin for the cough. Your symptoms should improve over the next 1 week to 10 days. If you fail to improve or worsen, then please come back  to see us.   Thanks for letting us take care of you!  Sincerely,  Devota Pacealeb Vence Lalor, MD Family Medicine  - PGY 2

## 2015-05-07 LAB — CULTURE, GROUP A STREP: STREP A CULTURE: POSITIVE — AB

## 2015-05-08 MED ORDER — AMOXICILLIN 500 MG PO CAPS: 500.0000 mg | ORAL_CAPSULE | Freq: Three times a day (TID) | ORAL | Status: DC

## 2015-05-08 NOTE — ED Notes (Signed)
Final report of Strep positive, Rx authorized by Link SnufferLK Sofia, sent to [facility] [address]

## 2015-05-28 ENCOUNTER — Telehealth: Payer: Self-pay | Admitting: Family Medicine

## 2015-05-28 NOTE — Telephone Encounter (Signed)
Pt called to speak to the doctor about her depression. She knows they haven't discussed this yet but she a little of a breakdown. She scheduled an office visit for 8/16 but would like to speak to him before then. jw

## 2015-05-28 NOTE — Telephone Encounter (Signed)
Will forward to MD. Yuta Cipollone,CMA  

## 2015-05-30 NOTE — Telephone Encounter (Signed)
Returned phone call to patient. She says that she has been feeling down for the past year but didn't have an episode of crying like she did recently (came home from work and just cried, she didn't have any triggers or reason at work and she is not sure why that happened). She denies any suicidal thoughts. She has an appt scheduled with me on 8/16 and I told her that would be best to discuss this more in depth at that visit. I told her if she were to have an suicidal thoughts to call the clinic or go to ED immediately. Patient acknowledged and was appreciative. -Dr. Waynetta Sandy

## 2015-06-10 ENCOUNTER — Ambulatory Visit (INDEPENDENT_AMBULATORY_CARE_PROVIDER_SITE_OTHER): Payer: Managed Care, Other (non HMO) | Admitting: Family Medicine

## 2015-06-10 ENCOUNTER — Encounter: Payer: Self-pay | Admitting: Family Medicine

## 2015-06-10 VITALS — BP 113/78 | HR 95 | Temp 98.7°F | Ht 64.0 in | Wt 107.0 lb

## 2015-06-10 DIAGNOSIS — F329 Major depressive disorder, single episode, unspecified: Secondary | ICD-10-CM | POA: Diagnosis not present

## 2015-06-10 DIAGNOSIS — F32A Depression, unspecified: Secondary | ICD-10-CM

## 2015-06-10 MED ORDER — SERTRALINE HCL 50 MG PO TABS: 50.0000 mg | ORAL_TABLET | Freq: Every day | ORAL | Status: DC

## 2015-06-10 NOTE — Progress Notes (Signed)
   Subjective:    Patient ID: Ariel Henson, female    DOB: 08-20-1986, 29 y.o.   MRN: [id number]  HPI  CC/Presenting Issue: depression  Report of symptoms: frequent crying episodes that she is unable to stop, feelings of depression and stress particularly over her diagnosis of sarcoidosis (states she had recent PFTs and has lost 10% of her lung function over a year, concerned she will not have any lungs left in 10 years). She lives alone currently with her dog (pitbull). She says she is too nice at times and gets annoyed with being taken advantage of from this, specifically to the point she doesn't want to meet new friends.   Duration of CURRENT symptoms: 1 year, worse past month Age of onset of first mood disturbance: 28  Impact on function: states co-workers have noticed she is not acting her usual self. Crying episodes throughout the day she is not able to stop. Difficulty getting out of bed  Psychiatric History - Diagnoses: none - Hospitalizations:  none - Pharmacotherapy: none - Outpatient therapy: none  Family history of psychiatric issues: did not address  Current and history of substance use: current smoker, goes to bar with friends  PHQ-9: 14 (1,1,3,3,3,1,1,1,0 / somewhat difficult) GAD7: not done  Review of Systems   See HPI for ROS.   Past medical history, surgical, family, and social history reviewed and updated in the EMR as appropriate. Objective:  BP 113/78 mmHg  Pulse 95  Temp(Src) 98.7 F (37.1 C) (Oral)  Ht  (1.626 m)  Wt 107 lb (48.535 kg)  BMI 18.36 kg/m2  LMP 06/05/2015 Vitals and nursing note reviewed  General: tearful during interview Psych: mood depressed, affect congruent. She has normal thought content and speech. No SI or HI.  Assessment & Plan:  Depression PHQ9 14. No SI or HI. This is her first episode. She has significant stressors in her life currently with diagnosis of sarcoidosis in past 1-2 years, she is very worried about this  diagnosis and her decline in health. [facility] was able to speak with her today and plan on following up with her in 1 week. She would like to start medicine, rx sent for zoloft. F/u 6 weeks.

## 2015-06-10 NOTE — Progress Notes (Signed)
Dr. Waynetta Sandy requested a Behavioral Health Consult.  Presenting Issue: Depression.  Report of symptoms: Uncontrollable crying, lethargy, and feelings of helplessness.  Duration of CURRENT symptoms: Since receiving sarcoidosis diagnosis last year; however, patient reports that symptoms have significantly increased over the past month or so, prompting her to seek help.   Age of onset of first mood disturbance: 28   Impact on function: Patient reported that, lately, she sometimes spends all day crying "for no reason," has difficulty getting out of bed, has trouble "hiding her sadness" at work, and does not feel motivated to leave the apartment. She said that her current mood is very concerning to her because she is normally a "very happy" person.    Assessment / Plan / Recommendations:  [facility] determined that patient is experiencing significant emotional distress due to health problems, the recent relocation of her mother, and feelings of loneliness. When asked, patient said that she would like to return next week to see [facility] and discuss therapy options, problem solve, and talk further about her current life stressors. [facility] and patient decided that she would return on 06/17/15 for a follow-up with [facility].

## 2015-06-13 DIAGNOSIS — F329 Major depressive disorder, single episode, unspecified: Secondary | ICD-10-CM | POA: Insufficient documentation

## 2015-06-13 DIAGNOSIS — F32A Depression, unspecified: Secondary | ICD-10-CM | POA: Insufficient documentation

## 2015-06-13 NOTE — Assessment & Plan Note (Signed)
PHQ9 14. No SI or HI. This is her first episode. She has significant stressors in her life currently with diagnosis of sarcoidosis in past 1-2 years, she is very worried about this diagnosis and her decline in health. [facility] was able to speak with her today and plan on following up with her in 1 week. She would like to start medicine, rx sent for zoloft. F/u 6 weeks.

## 2015-06-17 ENCOUNTER — Ambulatory Visit: Payer: Managed Care, Other (non HMO) | Admitting: Psychology

## 2015-06-23 ENCOUNTER — Telehealth: Payer: Self-pay | Admitting: Family Medicine

## 2015-06-23 NOTE — Telephone Encounter (Signed)
Would like for PCP to return call because she has questions about her FMLA paperwork. Ariel Henson, ASA

## 2015-06-23 NOTE — Telephone Encounter (Signed)
Will forward to MD. Jazmin Hartsell,CMA  

## 2015-06-23 NOTE — Telephone Encounter (Signed)
Pt requesting updated FMLA paperwork for depression. I said I would be able to do this if she brought in paperwork. She currently has FMLA paperwork for her sarcoidosis but went over with her recent visits for depression. She has an appt tomorrow with behavioral clinic and will bring paperwork then. -Dr. Waynetta Sandy

## 2015-06-24 ENCOUNTER — Ambulatory Visit (INDEPENDENT_AMBULATORY_CARE_PROVIDER_SITE_OTHER): Payer: Managed Care, Other (non HMO) | Admitting: Psychology

## 2015-06-24 ENCOUNTER — Telehealth: Payer: Self-pay | Admitting: Family Medicine

## 2015-06-24 DIAGNOSIS — F329 Major depressive disorder, single episode, unspecified: Secondary | ICD-10-CM

## 2015-06-24 DIAGNOSIS — F32A Depression, unspecified: Secondary | ICD-10-CM

## 2015-06-24 NOTE — Assessment & Plan Note (Signed)
Ariel Henson seems to be experiencing much less sadness and despair compared to two weeks ago, but she would like to reduce her smoking. We discussed relaxation techniques, including deep breathing and progressive muscle relaxation, that Loyal can use to relieve stress when she is feeling overwhelmed. We also discussed replacement behaviors that may aid in smoking cessation, such as chewing on a toothpick, sucking on a lollipop, or eating sunflower seeds (suggested by [facility]). She agreed to make an appointment with Dr. Waynetta Sandy to discuss prescription nicotine inhalers and will return for a follow-up IC appointment on September 20th at 9:30 am.    PHQ-9 today is      .  Anhedonia:   Not at all  Mood:  Not at all Sleep:  Several days Energy: Several days Appetite: Nearly every day Worthless: Not at all Concentrate: Not at all Psychomotor: Not at all SI:  Not at all

## 2015-06-24 NOTE — Telephone Encounter (Signed)
Patient requests PCP to complete Form for work purpose. Please, follow up with Patient.

## 2015-06-24 NOTE — Progress Notes (Signed)
Reason for follow-up:  Integrated care; to discuss depressive symptoms, medication, and smoking cessation  Issues discussed:  Ariel Henson reported that she filled her antidepressant prescription two days after meeting with Dr. Waynetta Sandy and I, has been taking it consistently, and has not missed any doses. She takes the medication every night before she goes to sleep. She also reported that she is tolerating the medication well and is not currently experiencing any side effects. When asked, she said that she has felt "very little" sadness in the past week and feels that the medication is working well, so she plans to continue taking it. She added that people at work have recently stopped asking her "what's wrong?" and she believes that this is another indication of the medication's efficacy.   Ariel Henson and I also discussed her current level of smoking. She reported that she has been smoking about 5 to 6 cigarettes per day, half a cigarette at a time. Stressors such as work and school were identified as triggers for cravings. She and I then discussed potential methods for smoking cessation.  Identified goals:  Management of depressive symptoms as evidenced by patient-report and low scores on the PHQ-9.

## 2015-06-25 NOTE — Telephone Encounter (Signed)
Will place form in providers box. Jazmin Hartsell,CMA  

## 2015-06-25 NOTE — Telephone Encounter (Signed)
Patient informed that forms are ready for pick up.  Patient request that forms be faxed.  Original placed up front for pick up.  Clovis Pu, RN

## 2015-06-25 NOTE — Telephone Encounter (Signed)
FMLA paperwork filled out and left with Tamika. -Dr. Waynetta Sandy

## 2015-07-03 ENCOUNTER — Encounter: Payer: Self-pay | Admitting: Family Medicine

## 2015-07-03 ENCOUNTER — Telehealth: Payer: Self-pay | Admitting: Family Medicine

## 2015-07-03 NOTE — Telephone Encounter (Signed)
Requesting a letter to either be emailed or faxed (if possible) stating she started her treatment for depression on 8/16.

## 2015-07-03 NOTE — Telephone Encounter (Signed)
Will forward to PCP for review. Senita Corredor, CMA. 

## 2015-07-03 NOTE — Telephone Encounter (Signed)
Called pt and got fax# at [phone] and faxed today. Advised pt to call back if it was not received today as she needed it by today. Seva Chancy, CMA.

## 2015-07-03 NOTE — Telephone Encounter (Signed)
I drafted a letter stating this information. Please call the patient and ask her for the fax number this should be sent to. Thanks. -Dr. Waynetta Sandy

## 2015-07-09 ENCOUNTER — Telehealth: Payer: Self-pay | Admitting: Pulmonary Disease

## 2015-07-09 NOTE — Telephone Encounter (Signed)
Called and spoke to pt. Pt c/o right lateral back pain , "in the lungs". Pt stated she has completed the tylenol #3 and is now taking aleve about twice per week. Pt stated her pain is at 4-5/10 when taking the aleve. Pt states when the pain is present it is a sharp pain that worsens when she moves. Tylenol #3 last filled on 5.11.16 for #60 with 0 refills. Pt has an upcoming appt with Dr. Vassie Loll on 9.19.16. Pt requesting this be addressed prior to appt.   Dr. Vassie Loll please advise. Thanks.

## 2015-07-09 NOTE — Telephone Encounter (Signed)
Left message for pt to call back to discuss

## 2015-07-09 NOTE — Telephone Encounter (Signed)
OK to refill tylenol #3 [facility] CXR on visit 9/19

## 2015-07-10 MED ORDER — ACETAMINOPHEN-CODEINE #3 300-30 MG PO TABS: 1.0000 | ORAL_TABLET | Freq: Two times a day (BID) | ORAL | Status: DC | PRN

## 2015-07-10 NOTE — Telephone Encounter (Signed)
Pt aware of recs. RX called in. Nothing further needed 

## 2015-07-14 ENCOUNTER — Ambulatory Visit: Payer: Managed Care, Other (non HMO) | Admitting: Pulmonary Disease

## 2015-07-15 ENCOUNTER — Telehealth: Payer: Self-pay | Admitting: Family Medicine

## 2015-07-15 ENCOUNTER — Ambulatory Visit (INDEPENDENT_AMBULATORY_CARE_PROVIDER_SITE_OTHER)
Admission: RE | Admit: 2015-07-15 | Discharge: 2015-07-15 | Disposition: A | Discharge status: Home / Self Care | Payer: Managed Care, Other (non HMO) | Source: Ambulatory Visit | Attending: Pulmonary Disease | Admitting: Pulmonary Disease

## 2015-07-15 ENCOUNTER — Ambulatory Visit (INDEPENDENT_AMBULATORY_CARE_PROVIDER_SITE_OTHER): Payer: Managed Care, Other (non HMO) | Admitting: Pulmonary Disease

## 2015-07-15 ENCOUNTER — Encounter: Payer: Self-pay | Admitting: Pulmonary Disease

## 2015-07-15 ENCOUNTER — Ambulatory Visit: Payer: Managed Care, Other (non HMO)

## 2015-07-15 ENCOUNTER — Telehealth: Payer: Self-pay | Admitting: Psychology

## 2015-07-15 VITALS — BP 102/68 | HR 89 | Ht 64.0 in | Wt 103.8 lb

## 2015-07-15 DIAGNOSIS — Z720 Tobacco use: Secondary | ICD-10-CM | POA: Diagnosis not present

## 2015-07-15 DIAGNOSIS — D869 Sarcoidosis, unspecified: Secondary | ICD-10-CM | POA: Diagnosis not present

## 2015-07-15 DIAGNOSIS — F17200 Nicotine dependence, unspecified, uncomplicated: Secondary | ICD-10-CM | POA: Insufficient documentation

## 2015-07-15 NOTE — Assessment & Plan Note (Addendum)
Her pain seems to have improved and plateaued I do not think there is more benefit to be gained by steroids-hence okay to stay off Flu shot recommended Keep appt with [facility]

## 2015-07-15 NOTE — Telephone Encounter (Signed)
error 

## 2015-07-15 NOTE — Patient Instructions (Addendum)
Flu shot recommended Keep appt with [facility] function shows effect of smoking - you have to stop! Trial of albuterol inhaler 2 puffs as needed

## 2015-07-15 NOTE — Telephone Encounter (Signed)
test

## 2015-07-15 NOTE — Progress Notes (Signed)
   Subjective:    Patient ID: Ariel Henson, female    DOB: Mar 13, 1986, 29 y.o.   MRN: [id number]  HPI  29 y.o. female for FU of sarcoidosis, presenting as multiple lung masses She works at [employer], since 2014. Denies known beryllium exposure at work .  About 5 pack years smoking Started on prednisone 01/2014 - tapered off by 06/2014, again 01/2015 - 05/2015  07/15/2015  Chief Complaint  Patient presents with  . Sarcoidosis    CXR done today. Dry cough, clearing throat. Wants to know when she is supposed to have her next breathing test.  declined flu shot    75m FU   Overall she feels much better she completed the tylenol #3 and took aleve about twice per week. Pt stated her pain is at 4-5/10 when taking the aleve. Pt states when the pain is present it is a sharp pain that worsens when she moves. Tylenol #3 last filled on 5.11.16 for #60  -refilled 06/2015 - could not afford Celebrex  She was advised to stay on 10 mg of prednisone, but discontinued this in August  06/2015 Spirometry FEV1 69% - dropped, with ratio 70 FVC stable at 85%  CXR - coarse markings, cavitation left upper lobe  Patient continues to smoke.Smokes 3-4 cigs/d   Significant tests/ events  CT chest 01/10/14:  Bilateral areas of focal consolidation with a large area of cavitation in the largest right lower lobe consolidative area and small areas of cavitation in a left upper lobe consolidative area.  ANA 1: 40 speckled, ANCA neg, ace level 39 , RA factor and sed rate 39  Quantiferon Gold - (indeterminate)  HIV -NR   CT bx on 01/21/14 (right lung) >>necrotizing granulomatous inflammation, afb cx neg.  PFTs 03/2014  (on 10 mg pred)- no restriction, DLCO nml PFTs 01/2015 - FVC dropped from 98% to 85%, TLC dropped from 85 to 74%, DLCO dropped from 101 to 82%  CT chest 02/11/15 bilateral areas of nodularity and parenchymal consolidation. Many of these have decreased in size. Increasing cavitation in the  left upper lobe consolidated area which has slightly enlarged in size. Mediastinal lymph nodes decreased in size.  01/2015 FMLA papers were filled out    Review of Systems neg for any significant sore throat, dysphagia, itching, sneezing, nasal congestion or excess/ purulent secretions, fever, chills, sweats, unintended wt loss, pleuritic or exertional cp, hempoptysis, orthopnea pnd or change in chronic leg swelling. Also denies presyncope, palpitations, heartburn, abdominal pain, nausea, vomiting, diarrhea or change in bowel or urinary habits, dysuria,hematuria, rash, arthralgias, visual complaints, headache, numbness weakness or ataxia.      Objective:   Physical Exam  Gen. Pleasant, well-nourished, in no distress ENT - no lesions, no post nasal drip Neck: No JVD, no thyromegaly, no carotid bruits Lungs: no use of accessory muscles, no dullness to percussion, clear without rales or rhonchi  Cardiovascular: Rhythm regular, heart sounds  normal, no murmurs or gallops, no peripheral edema Musculoskeletal: No deformities, no cyanosis or clubbing        Assessment & Plan:

## 2015-07-15 NOTE — Assessment & Plan Note (Signed)
Lung function -fev1 dropped to 69%, acute drop so hope not copd & should reverse Cessation encouraged, using nicotine gum Trial of albuterol inhaler 2 puffs as needed -reassess next visit

## 2015-07-22 ENCOUNTER — Telehealth: Payer: Self-pay | Admitting: Psychology

## 2015-07-22 ENCOUNTER — Ambulatory Visit: Payer: Managed Care, Other (non HMO)

## 2015-07-22 NOTE — Telephone Encounter (Signed)
Ariel Henson overslept and missed her appointment. I called to reschedule for next Tuesday (10/4) at 9:00 am.

## 2015-07-23 ENCOUNTER — Ambulatory Visit: Payer: Managed Care, Other (non HMO)

## 2015-07-29 ENCOUNTER — Ambulatory Visit (INDEPENDENT_AMBULATORY_CARE_PROVIDER_SITE_OTHER): Payer: Managed Care, Other (non HMO) | Admitting: Psychology

## 2015-07-29 DIAGNOSIS — Z720 Tobacco use: Secondary | ICD-10-CM

## 2015-07-29 DIAGNOSIS — F17200 Nicotine dependence, unspecified, uncomplicated: Secondary | ICD-10-CM

## 2015-07-29 NOTE — Assessment & Plan Note (Signed)
Assessment / Plan / Recommendations:  Ariel Henson seems to be experiencing far less depressive symptoms over the past few weeks and she attributes this to the Zoloft. Her functioning has improved, as she has been going out with friends more often and even reported making some new friends. Ariel Henson agreed to call Dr. Vassie Loll to request a nicotine inhaler prescription, and to use the smoking diary forms that I reduced in size and printed for her so that she can keep them taped to her cigarette packs. She will return next week for a follow-up appointment.

## 2015-07-29 NOTE — Progress Notes (Signed)
Reason for follow-up:  To discuss progress toward previously established goals, as well as current mood and functioning.  Issues discussed:  Ariel Henson reported that she is feeling much less depressed since beginning Zoloft several weeks ago. She also reported that she missed her doses for approximately one week and noticed a marked difference in mood. When asked what her barrier to consistently taking the medication was, she said that she has a bad memory and often forgets things. She added that she has begun posting notes around her house to remind her and has not missed a dose since.  Ariel Henson also reported that her recent lung function test at the pulmonologist revealed a drop in lung capacity, and her doctor told her that it is directly related the her continued smoking. She also started a smoking diary in her planner but has not been consistently using it. When asked, Ariel Henson reported currently smoking about 2 packs per week. I used motivational interviewing to explore her ambivalence about quitting. Ariel Henson will return next week for another IC appointment.  Importance: 7 or 8 Confidence: 10 if she commits  Identified goals:  Call Dr. Reginia Naas office (pulmonologist) to request a prescription for a nicotine inhaler that she and he had previously discussed, and use new smoking diary consistently.

## 2015-08-05 ENCOUNTER — Telehealth: Payer: Self-pay | Admitting: Psychology

## 2015-08-05 ENCOUNTER — Ambulatory Visit: Payer: Managed Care, Other (non HMO)

## 2015-08-05 NOTE — Telephone Encounter (Signed)
Ariel Henson missed her IC follow-up appointment this morning. Called to reschedule, but the phone went straight to voicemail. Unable to leave a message because the voicemail was full.

## 2015-09-29 ENCOUNTER — Encounter: Payer: Self-pay | Admitting: Pulmonary Disease

## 2015-09-29 ENCOUNTER — Ambulatory Visit: Payer: Managed Care, Other (non HMO) | Admitting: Pulmonary Disease

## 2015-09-29 ENCOUNTER — Ambulatory Visit (INDEPENDENT_AMBULATORY_CARE_PROVIDER_SITE_OTHER): Payer: Managed Care, Other (non HMO) | Admitting: Pulmonary Disease

## 2015-09-29 VITALS — BP 100/60 | HR 67 | Ht 63.0 in | Wt 109.2 lb

## 2015-09-29 DIAGNOSIS — Z720 Tobacco use: Secondary | ICD-10-CM | POA: Diagnosis not present

## 2015-09-29 DIAGNOSIS — D869 Sarcoidosis, unspecified: Secondary | ICD-10-CM | POA: Diagnosis not present

## 2015-09-29 DIAGNOSIS — F17200 Nicotine dependence, unspecified, uncomplicated: Secondary | ICD-10-CM

## 2015-09-29 MED ORDER — NICOTINE 14 MG/24HR TD PT24: 14.0000 mg | MEDICATED_PATCH | Freq: Every day | TRANSDERMAL | Status: DC

## 2015-09-29 NOTE — Patient Instructions (Addendum)
Trial of nicotine patch 14 mg daily If breakthrough on this, can use nicotine gum as needed  If these do not help you quit over next 2 weeks - call us back for Rx for nicotrol inhaler  Breathing test shows stable  lung function

## 2015-09-29 NOTE — Progress Notes (Signed)
   Subjective:    Patient ID: Ariel DadaOdette Henson, female    DOB: 06-28-1986, 29 y.o.   MRN: [id number]  HPI  29 y.o. female for FU of sarcoidosis, presenting as multiple lung masses She works at [employer], since 2014. Denies known beryllium exposure at work .  About 5 pack years smoking Started on prednisone 01/2014 - tapered off by 06/2014, again 01/2015 - 05/2015   09/29/2015  Chief Complaint  Patient presents with  . Follow-up    No complaints, wants to quit smoking.. would like to try Nicotine Inhaler.   7369m FU   Pain persists - unchanged Breathing ok Tylenol #3  filled on 5.11.16 for #60  -refilled 06/2015 - could not afford Celebrex  Off  Prednisone since August  Patient continues to smoke.Smokes 1/2 PPD  - would like to trial nicotrol inhaler - was suggested by her psychologist  Spirometry 09/2015  FEV1 72%, ratio 68, FVC 92%    Significant tests/ events  CT chest 01/10/14:  Bilateral areas of focal consolidation with a large area of cavitation in the largest right lower lobe consolidative area and small areas of cavitation in a left upper lobe consolidative area.  ANA 1: 40 speckled, ANCA neg, ace level 39 , RA factor and sed rate 39  Quantiferon Gold - (indeterminate)  HIV -NR   CT bx on 01/21/14 (right lung) >>necrotizing granulomatous inflammation, afb cx neg.    PFTs 03/2014  (on 10 mg pred)- no restriction, DLCO nml PFTs 01/2015 - FVC dropped from 98% to 85%, TLC dropped from 85 to 74%, DLCO dropped from 101 to 82%  06/2015 Spirometry FEV1 69% - dropped, with ratio 70 FVC stable at 85%   CT chest 02/11/15 bilateral areas of nodularity and parenchymal consolidation. Many of these have decreased in size. Increasing cavitation in the left upper lobe consolidated area which has slightly enlarged in size. Mediastinal lymph nodes decreased in size.  01/2015 FMLA papers were filled out    Review of Systems neg for any significant sore throat, dysphagia,  itching, sneezing, nasal congestion or excess/ purulent secretions, fever, chills, sweats, unintended wt loss, pleuritic or exertional cp, hempoptysis, orthopnea pnd or change in chronic leg swelling. Also denies presyncope, palpitations, heartburn, abdominal pain, nausea, vomiting, diarrhea or change in bowel or urinary habits, dysuria,hematuria, rash, arthralgias, visual complaints, headache, numbness weakness or ataxia.     Objective:   Physical Exam  Gen. Pleasant, well-nourished, in no distress ENT - no lesions, no post nasal drip Neck: No JVD, no thyromegaly, no carotid bruits Lungs: no use of accessory muscles, no dullness to percussion, clear without rales or rhonchi  Cardiovascular: Rhythm regular, heart sounds  normal, no murmurs or gallops, no peripheral edema Musculoskeletal: No deformities, no cyanosis or clubbing        Assessment & Plan:

## 2015-09-30 NOTE — Assessment & Plan Note (Signed)
Lung function had dropped in September 2016 but has now stabilized. I think this is related to her ongoing smoking rather than worsening sarcoidosis No more prednisone required

## 2015-09-30 NOTE — Assessment & Plan Note (Signed)
Counseled extensively on smoking cessation  Trial of nicotine patch 14 mg daily If breakthrough on this, can use nicotine gum as needed  If these do not help you quit over next 2 weeks - call us back for Rx for nicotrol inhaler

## 2015-12-23 ENCOUNTER — Encounter: Payer: Self-pay | Admitting: Family Medicine

## 2015-12-23 ENCOUNTER — Ambulatory Visit (INDEPENDENT_AMBULATORY_CARE_PROVIDER_SITE_OTHER): Payer: Managed Care, Other (non HMO) | Admitting: Family Medicine

## 2015-12-23 ENCOUNTER — Ambulatory Visit
Admission: RE | Admit: 2015-12-23 | Discharge: 2015-12-23 | Disposition: A | Discharge status: Home / Self Care | Payer: Managed Care, Other (non HMO) | Source: Ambulatory Visit | Attending: Family Medicine | Admitting: Family Medicine

## 2015-12-23 VITALS — BP 106/71 | HR 95 | Temp 99.1°F | Wt 108.8 lb

## 2015-12-23 DIAGNOSIS — M25532 Pain in left wrist: Secondary | ICD-10-CM

## 2015-12-23 NOTE — Assessment & Plan Note (Signed)
Left wrist pain with fluctuant swelling on dorsal aspect consistent with ganglion cyst.  Discussed benign nature of cysts and potential to resolve without treatment;  However, the pain is interfering with her daily functioning and job.  -  Aspiration attempt at pain clinic able to remove some mucinous fluid, but patient unable to tolerate completing the aspiration  -  Referred to hand surgeon for definitive treatment

## 2015-12-23 NOTE — Patient Instructions (Signed)
The swelling on her wrists is likely a dorsal ganglion.  We aspirated some of the fluid today, which may help relieve the pain and swelling.  You can use ice for 15-20 minutes 2-3 times a day as needed.  -  I have ordered x-rays of your left wrist: Will Notify you of the results.  -  Most of these ganglia and will resolved without treatment;  However, I have referred her to hand surgery to discussed the merits of surgery if the pain continues  Ganglion Cyst A ganglion cyst is a noncancerous, fluid-filled lump that occurs near joints or tendons. The ganglion cyst grows out of a joint or the lining of a tendon. It most often develops in the hand or wrist, but it can also develop in the shoulder, elbow, hip, knee, ankle, or foot. The round or oval ganglion cyst can be the size of a pea or larger than a grape. Increased activity may enlarge the size of the cyst because more fluid starts to build up.  CAUSES It is not known what causes a ganglion cyst to grow. However, it may be related to:  Inflammation or irritation around the joint.  An injury.  Repetitive movements or overuse.  Arthritis. RISK FACTORS Risk factors include:  Being a woman.  Being age 14-50. SIGNS AND SYMPTOMS Symptoms may include:   A lump. This most often appears on the hand or wrist, but it can occur in other areas of the body.  Tingling.  Pain.  Numbness.  Muscle weakness.  Weak grip.  Less movement in a joint. DIAGNOSIS Ganglion cysts are most often diagnosed based on a physical exam. Your health care provider will feel the lump and may shine a light alongside it. If it is a ganglion cyst, a light often shines through it. Your health care provider may order an X-ray, ultrasound, or MRI to rule out other conditions. TREATMENT Ganglion cysts usually go away on their own without treatment. If pain or other symptoms are involved, treatment may be needed. Treatment is also needed if the ganglion cyst limits  your movement or if it gets infected. Treatment may include:  Wearing a brace or splint on your wrist or finger.  Taking anti-inflammatory medicine.  Draining fluid from the lump with a needle (aspiration).  Injecting a steroid into the joint.  Surgery to remove the ganglion cyst. HOME CARE INSTRUCTIONS  Do not press on the ganglion cyst, poke it with a needle, or hit it.  Take medicines only as directed by your health care provider.  Wear your brace or splint as directed by your health care provider.  Watch your ganglion cyst for any changes.  Keep all follow-up visits as directed by your health care provider. This is important. SEEK MEDICAL CARE IF:  Your ganglion cyst becomes larger or more painful.  You have increased redness, red streaks, or swelling.  You have pus coming from the lump.  You have weakness or numbness in the affected area.  You have a fever or chills.   This information is not intended to replace advice given to you by your health care provider. Make sure you discuss any questions you have with your health care provider.   Document Released: 10/08/2000 Document Revised: 11/01/2014 Document Reviewed: 03/26/2014 Elsevier Interactive Patient Education [employer]

## 2015-12-23 NOTE — Progress Notes (Signed)
   Subjective:    Patient ID: Ariel Henson, female    DOB: 09/20/1986, 29 y.o.   MRN: [id number]  Seen for Same day visit for   CC:  Left wrist pain  She reports pain over the dorsolateral aspect of her left wrist for the past 2-3 weeks.  She is also noticed swelling in the hand.  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  She reports previous left wrist injury about a year ago that she did not seek medical treatment for,  However, she reports intermittent dull achy pain since this incident.  Pain is burning sensation.  No associated fevers, skin rash.     smoking history noted   past medical history:  sarcoidosis  Objective:  BP 106/71 mmHg  Pulse 95  Temp(Src) 99.1 F (37.3 C) (Oral)  Wt 108 lb 12.8 oz (49.351 kg)  LMP 12/21/2015 (Exact Date)  General: NAD Left Wrist:  Tender, fluctuant swelling over dorsal lateral aspect, mobile.  Grip strength and Sensation intact.     Assessment & Plan:   Left wrist pain  Left wrist pain with fluctuant swelling on dorsal aspect consistent with ganglion cyst.  Discussed benign nature of cysts and potential to resolve without treatment;  However, the pain is interfering with her daily functioning and job.  -  Aspiration attempt at pain clinic able to remove some mucinous fluid, but patient unable to tolerate completing the aspiration  -  Referred to hand surgeon for definitive treatment   The risks and benefits of the procedure were discussed with the patient. Verbal  consent was obtained. The area was prepped in a sterile fashion. With a 27 gauge needle and 0.5 mL of 1% Lidocaine the area was anesthetized. With a 18 gauge needle the cyst was aspirated, approximately 1 cc of viscous clear fluid was removed. A compression bandage was applied. Patient tolerated the procedure well. No complication. The wound was hemostatic.

## 2015-12-24 ENCOUNTER — Encounter: Payer: Self-pay | Admitting: Family Medicine

## 2016-01-22 ENCOUNTER — Ambulatory Visit (INDEPENDENT_AMBULATORY_CARE_PROVIDER_SITE_OTHER): Payer: Managed Care, Other (non HMO) | Admitting: Pulmonary Disease

## 2016-01-22 ENCOUNTER — Encounter: Payer: Self-pay | Admitting: Pulmonary Disease

## 2016-01-22 VITALS — BP 92/60 | HR 75 | Ht 64.0 in | Wt 111.0 lb

## 2016-01-22 DIAGNOSIS — Z23 Encounter for immunization: Secondary | ICD-10-CM

## 2016-01-22 DIAGNOSIS — Z720 Tobacco use: Secondary | ICD-10-CM | POA: Diagnosis not present

## 2016-01-22 DIAGNOSIS — D869 Sarcoidosis, unspecified: Secondary | ICD-10-CM

## 2016-01-22 DIAGNOSIS — F17200 Nicotine dependence, unspecified, uncomplicated: Secondary | ICD-10-CM

## 2016-01-22 MED ORDER — ALBUTEROL SULFATE 108 (90 BASE) MCG/ACT IN AEPB: 2.0000 | INHALATION_SPRAY | Freq: Four times a day (QID) | RESPIRATORY_TRACT | Status: DC | PRN

## 2016-01-22 NOTE — Assessment & Plan Note (Signed)
You have to quit smoking! We discussed use of Chantix

## 2016-01-22 NOTE — Patient Instructions (Signed)
CT chest without contrast Based on this we will decide whether we need to repeat your breathing test Trial of albuterol MDI 2 puffs every every 6 hours as needed for wheezing or shortness of breath  You have to quit smoking! We discussed use of Chantix

## 2016-01-22 NOTE — Progress Notes (Signed)
   Subjective:    Patient ID: Ariel Henson, female    DOB: 10-Jan-1986, 30 y.o.   MRN: [id number]  HPI  30 y.o. smoker for FU of sarcoidosis, presenting as multiple lung masses She works at [employer], since 2014. Denies known beryllium exposure at work .  Started on prednisone 01/2014 - tapered off by 06/2014, again 01/2015 - 05/2015  01/22/2016  Chief Complaint  Patient presents with  . Follow-up    Pt states that breathing has been doing okay since last OV. Pt reports having a cough since last OV 09/2015 - dry cough with very little mucus at times.    1333m FU   She continues to smoke about a pack every week Chest pain is much improved and now only mild and in the background and improved with ibuprofen She reports intermittent shortness of breath and occasional expiratory wheezing   Off  Prednisone since August/ 2016   Significant tests/ events  CT chest 01/10/14:  Bilateral areas of focal consolidation with a large area of cavitation in the largest right lower lobe consolidative area and small areas of cavitation in a left upper lobe consolidative area.  ANA 1: 40 speckled, ANCA neg, ace level 39 , RA factor and sed rate 39  Quantiferon Gold - (indeterminate)  HIV -NR   CT bx on 01/21/14 (right lung) >>necrotizing granulomatous inflammation, afb cx neg.    PFTs 03/2014  (on 10 mg pred)- no restriction, DLCO nml PFTs 01/2015 - FVC dropped from 98% to 85%, TLC dropped from 85 to 74%, DLCO dropped from 101 to 82%  06/2015 Spirometry FEV1 69% - dropped, with ratio 70 FVC stable at 85%  Spirometry 09/2015  FEV1 72%, ratio 68, FVC 92%  CT chest 02/11/15 bilateral areas of nodularity and parenchymal consolidation. Many of these have decreased in size. Increasing cavitation in the left upper lobe consolidated area which has slightly enlarged in size. Mediastinal lymph nodes decreased in size.  01/2015 FMLA papers were filled out   Review of Systems Patient denies  significant dyspnea,cough, hemoptysis,  chest pain, palpitations, pedal edema, orthopnea, paroxysmal nocturnal dyspnea, lightheadedness, nausea, vomiting, abdominal or  leg pains      Objective:   Physical Exam  Gen. Pleasant, thin, in no distress ENT - no lesions, no post nasal drip Neck: No JVD, no thyromegaly, no carotid bruits Lungs: no use of accessory muscles, no dullness to percussion, clear without rales or rhonchi  Cardiovascular: Rhythm regular, heart sounds  normal, no murmurs or gallops, no peripheral edema Musculoskeletal: No deformities, no cyanosis or clubbing        Assessment & Plan:

## 2016-01-22 NOTE — Assessment & Plan Note (Addendum)
CT chest without contrast Based on this we will decide whether we need to repeat your breathing test Trial of albuterol MDI 2 puffs every every 6 hours as needed for wheezing or shortness of breath -if no improvement will consider steroid/ LABA combination or short course of oral steroids

## 2016-01-26 ENCOUNTER — Ambulatory Visit (INDEPENDENT_AMBULATORY_CARE_PROVIDER_SITE_OTHER)
Admission: RE | Admit: 2016-01-26 | Discharge: 2016-01-26 | Disposition: A | Discharge status: Home / Self Care | Payer: Managed Care, Other (non HMO) | Source: Ambulatory Visit | Attending: Pulmonary Disease | Admitting: Pulmonary Disease

## 2016-01-26 DIAGNOSIS — Z720 Tobacco use: Secondary | ICD-10-CM

## 2016-01-26 DIAGNOSIS — D869 Sarcoidosis, unspecified: Secondary | ICD-10-CM

## 2016-01-26 DIAGNOSIS — F17200 Nicotine dependence, unspecified, uncomplicated: Secondary | ICD-10-CM

## 2016-03-02 ENCOUNTER — Telehealth: Payer: Self-pay | Admitting: Family Medicine

## 2016-03-02 NOTE — Telephone Encounter (Signed)
Patient dropping off forms for her PCP to fill out, please call patient w/questions, thanks

## 2016-03-02 NOTE — Telephone Encounter (Signed)
Need to have forms for FMLA completed by provider.  Will drop off.

## 2016-03-02 NOTE — Telephone Encounter (Signed)
Disregard. Duplicate note. Ariel Henson,CMA

## 2016-03-03 NOTE — Telephone Encounter (Signed)
Spoke with patient to confirm conditions that she is needing these forms completed for.  Patient states that they are for her sarcoidosis and her depression.  Patient also mentioned that her treatments would have to match her amount of conditions, also that she would need these by 03/08/16 or the claim will be dropped.  I advised patient that we usually ask for at least 5-7 business days for completion but that I would mention this deadline to the provider.  She voiced understanding and forms placed in provider's box. Ariel Henson,CMA

## 2016-03-05 NOTE — Telephone Encounter (Signed)
FMLA filled out, faxed, called patient to verify it was received on Monday and if not call us to have it re-faxed.

## 2016-03-08 NOTE — Telephone Encounter (Signed)
Patient calling back, would like to speak with MD or nurse. Has some questions regarding what was put on FMLA form.

## 2016-03-09 NOTE — Telephone Encounter (Signed)
I spoke with patient this morning, she states she needs her FMLA papers to say, "3 conditions a month, at bare minimum 2 conditions a month." Pt states the way it was written does not work for her HR.

## 2016-03-09 NOTE — Telephone Encounter (Signed)
Changed this on the FMLA paperwork and should be faxed this morning.

## 2016-03-19 ENCOUNTER — Encounter: Payer: Self-pay | Admitting: Family Medicine

## 2016-03-19 ENCOUNTER — Ambulatory Visit (INDEPENDENT_AMBULATORY_CARE_PROVIDER_SITE_OTHER): Payer: Managed Care, Other (non HMO) | Admitting: Student

## 2016-03-19 VITALS — BP 115/73 | HR 88 | Temp 98.6°F | Wt 105.0 lb

## 2016-03-19 DIAGNOSIS — D869 Sarcoidosis, unspecified: Secondary | ICD-10-CM

## 2016-03-19 DIAGNOSIS — R21 Rash and other nonspecific skin eruption: Secondary | ICD-10-CM | POA: Diagnosis not present

## 2016-03-19 DIAGNOSIS — M6749 Ganglion, multiple sites: Secondary | ICD-10-CM | POA: Diagnosis not present

## 2016-03-19 DIAGNOSIS — IMO0002 Reserved for concepts with insufficient information to code with codable children: Secondary | ICD-10-CM

## 2016-03-19 NOTE — Patient Instructions (Signed)
Follow up with hand surgeon for cyst removal Your rash is likely benign and while it may be associated with Sarcoid, at this time you do not have significant symptoms. If your rash gets worse call the office for evaluation If you have questions or concerns, call the office at [phone]

## 2016-03-19 NOTE — Progress Notes (Signed)
   Subjective:    Patient ID: Ariel Henson, female    DOB: 1986/02/05, 30 y.o.   MRN: [id number]   CC: cyst on left wrist  HPI: 30 y/o F with sarcoidosis presents for cyst on her left wrist  Left wrist cyst - Was aspirated on 2/28 with improvement but she noticed it started to again grow approximately 1 week ago - It is mildly tender to palpation - denies decreased range of motion of her wrist or weakness - does not wish to get have it drained today - would to see a hand surgeon and while the refferal was made she did not get a call and lost the contact information for teh hand surgeon   Rash on legs - she also reports a rash on bilateral legs she first noted several months ago but feels it is more prominent now - denies pain, itching of the rash  Smoking status reviewed  Review of Systems Per HPI else denies, SOB, chest pain, recent illness, fevers     Objective:  BP 115/73 mmHg  Pulse 88  Temp(Src) 98.6 F (37 C) (Oral)  Wt 105 lb (47.628 kg)  LMP 03/05/2016 Vitals and nursing note reviewed  General: NAD Cardiac: RRR, Respiratory: CTAB, normal effort Extremities: cyst on anterior left wrist, non tender to palpation, non erythematous,  no edema or cyanosis. WWP. Skin: barely perceptible skin changes on bilateral LEs, non tender, non erythemmatous Neuro: alert and oriented   Assessment & Plan:    Cyst in hand Left hand cyst recurrence. She requests that she have it drained by a hand surgeon - the referral was made previously - will give her the surgeon's contact information  Rash Reported rash on her legs that is difficult to appreciate on exam. She requests referral to rheumatology for management of her sarcoidosis - Will refer to rheumatology - will continue to monitor rash and treat as needed     Alyssa A. Kennon RoundsHaney MD, MS Family Medicine Resident PGY-2 Pager [phone]

## 2016-03-21 DIAGNOSIS — R21 Rash and other nonspecific skin eruption: Secondary | ICD-10-CM | POA: Insufficient documentation

## 2016-03-21 DIAGNOSIS — IMO0002 Reserved for concepts with insufficient information to code with codable children: Secondary | ICD-10-CM | POA: Insufficient documentation

## 2016-03-21 NOTE — Assessment & Plan Note (Signed)
Reported rash on her legs that is difficult to appreciate on exam. She requests referral to rheumatology for management of her sarcoidosis - Will refer to rheumatology - will continue to monitor rash and treat as needed

## 2016-03-21 NOTE — Assessment & Plan Note (Signed)
Left hand cyst recurrence. She requests that she have it drained by a hand surgeon - the referral was made previously - will give her the surgeon's contact information

## 2016-03-25 ENCOUNTER — Telehealth: Payer: Self-pay | Admitting: Family Medicine

## 2016-03-25 NOTE — Telephone Encounter (Signed)
Patient requesting to speak to Dr. Waynetta SandyWight about FMLA paperwork. Please call patient.

## 2016-04-02 ENCOUNTER — Ambulatory Visit (INDEPENDENT_AMBULATORY_CARE_PROVIDER_SITE_OTHER): Payer: Managed Care, Other (non HMO) | Admitting: Family Medicine

## 2016-04-02 ENCOUNTER — Encounter: Payer: Self-pay | Admitting: Family Medicine

## 2016-04-02 ENCOUNTER — Ambulatory Visit
Admission: RE | Admit: 2016-04-02 | Discharge: 2016-04-02 | Disposition: A | Discharge status: Home / Self Care | Payer: Managed Care, Other (non HMO) | Source: Ambulatory Visit | Attending: Family Medicine | Admitting: Family Medicine

## 2016-04-02 VITALS — BP 104/70 | HR 80 | Temp 98.6°F | Wt 105.0 lb

## 2016-04-02 DIAGNOSIS — D869 Sarcoidosis, unspecified: Secondary | ICD-10-CM

## 2016-04-02 DIAGNOSIS — Z9109 Other allergy status, other than to drugs and biological substances: Secondary | ICD-10-CM

## 2016-04-02 DIAGNOSIS — R042 Hemoptysis: Secondary | ICD-10-CM

## 2016-04-02 DIAGNOSIS — Z720 Tobacco use: Secondary | ICD-10-CM

## 2016-04-02 DIAGNOSIS — E559 Vitamin D deficiency, unspecified: Secondary | ICD-10-CM

## 2016-04-02 DIAGNOSIS — F17200 Nicotine dependence, unspecified, uncomplicated: Secondary | ICD-10-CM

## 2016-04-02 DIAGNOSIS — J302 Other seasonal allergic rhinitis: Secondary | ICD-10-CM

## 2016-04-02 DIAGNOSIS — Z91048 Other nonmedicinal substance allergy status: Secondary | ICD-10-CM | POA: Diagnosis not present

## 2016-04-02 LAB — CBC WITH DIFFERENTIAL/PLATELET
BASOS PCT: 0 %
Basophils Absolute: 0 cells/uL (ref 0–200)
EOS PCT: 9 %
Eosinophils Absolute: 414 cells/uL (ref 15–500)
HCT: 47.4 % — ABNORMAL HIGH (ref 35.0–45.0)
Hemoglobin: 15.5 g/dL (ref 11.7–15.5)
LYMPHS PCT: 14 %
Lymphs Abs: 644 cells/uL — ABNORMAL LOW (ref 850–3900)
MCH: 30.6 pg (ref 27.0–33.0)
MCHC: 32.7 g/dL (ref 32.0–36.0)
MCV: 93.7 fL (ref 80.0–100.0)
MONOS PCT: 11 %
MPV: 10.3 fL (ref 7.5–12.5)
Monocytes Absolute: 506 cells/uL (ref 200–950)
NEUTROS ABS: 3036 {cells}/uL (ref 1500–7800)
Neutrophils Relative %: 66 %
PLATELETS: 298 10*3/uL (ref 140–400)
RBC: 5.06 MIL/uL (ref 3.80–5.10)
RDW: 16.4 % — AB (ref 11.0–15.0)
WBC: 4.6 10*3/uL (ref 3.8–10.8)

## 2016-04-02 LAB — POCT URINE PREGNANCY: PREG TEST UR: NEGATIVE

## 2016-04-02 LAB — COMPLETE METABOLIC PANEL WITH GFR
ALT: 8 U/L (ref 6–29)
AST: 17 U/L (ref 10–30)
Albumin: 3.4 g/dL — ABNORMAL LOW (ref 3.6–5.1)
Alkaline Phosphatase: 82 U/L (ref 33–115)
BUN: 5 mg/dL — AB (ref 7–25)
CHLORIDE: 105 mmol/L (ref 98–110)
CO2: 24 mmol/L (ref 20–31)
CREATININE: 0.77 mg/dL (ref 0.50–1.10)
Calcium: 8.5 mg/dL — ABNORMAL LOW (ref 8.6–10.2)
GFR, Est Non African American: >89 mL/min (ref >=60)
GLUCOSE: 64 mg/dL — AB (ref 65–99)
Potassium: 4 mmol/L (ref 3.5–5.3)
SODIUM: 140 mmol/L (ref 135–146)
Total Bilirubin: 0.4 mg/dL (ref 0.2–1.2)
Total Protein: 7.3 g/dL (ref 6.1–8.1)

## 2016-04-02 LAB — POCT URINALYSIS DIPSTICK
BILIRUBIN UA: NEGATIVE
GLUCOSE UA: NEGATIVE
Leukocytes, UA: NEGATIVE
Nitrite, UA: NEGATIVE
Protein, UA: NEGATIVE
SPEC GRAV UA: 1.025
UROBILINOGEN UA: 0.2
pH, UA: 6

## 2016-04-02 LAB — POCT UA - MICROSCOPIC ONLY

## 2016-04-02 MED ORDER — BENZONATATE 100 MG PO CAPS: 100.0000 mg | ORAL_CAPSULE | Freq: Two times a day (BID) | ORAL | Status: DC | PRN

## 2016-04-02 MED ORDER — FLUTICASONE PROPIONATE 50 MCG/ACT NA SUSP: 2.0000 | Freq: Every day | NASAL | Status: DC

## 2016-04-02 MED ORDER — BUDESONIDE 180 MCG/ACT IN AEPB: 2.0000 | INHALATION_SPRAY | Freq: Two times a day (BID) | RESPIRATORY_TRACT | Status: DC

## 2016-04-02 MED ORDER — CETIRIZINE HCL 10 MG PO TABS
10.0000 mg | ORAL_TABLET | Freq: Every day | ORAL | Status: DC
Start: 2016-04-02 — End: 2016-06-03

## 2016-04-02 NOTE — Progress Notes (Signed)
   Subjective:    Patient ID: Ariel DadaOdette Corral, female    DOB: Oct 25, 1986, 30 y.o.   MRN: [id number]  Seen for Same day visit for   CC: Coughing up blood  She reports coughing up blood twice in the past 2 days.  Blood is minimal coated on sputum.  She has history of sarcoidosis, followed by pulmonology with appointment next week.  Reports persistent coughing since last seen by them 3 months ago.  Uses albuterol once every 2-3 days.  Also reports nasal congestion and runny nose for the past 1-2 weeks, which she associates with seasonal allergies.  She denies any chest pain, fever, difficulty breathing, palpitations, lower extremity swelling.  She denies any joint pain or rashes.  She continues to smoke half a pack per day. Uses alcohol occasionally on weekends Denies illicit drug use .  Review of Systems   See HPI for ROS. Objective:  BP 104/70 mmHg  Pulse 80  Temp(Src) 98.6 F (37 C) (Oral)  Wt 105 lb (47.628 kg)  SpO2 100%  LMP 03/30/2016  Vitals reviewed General: NAD Cardiac: RRR, normal heart sounds, no murmurs. 2+ radial and PT pulses bilaterally Respiratory: CTAB, normal effort Abdomen: soft, nontender, nondistended, no hepatic or splenomegaly. Bowel sounds present Extremities: no edema or cyanosis. WWP. Skin: warm and dry, no rashes noted Neuro: alert and oriented, no focal deficits    Assessment & Plan:   Hemoptysis Streak hemoptysis less than 1 week associated with chronic cough, continued smoking and sarcoidosis.  No signs of massive bleeding, hypoxia or difficulty breathing. Vitals stable and exam benign. Last PFT wnl - Recommended keeping appointment with pulmonologist next week - Check CBC with differential, CMP, PT/INR, vitamin D, and UA - Chest x-ray ordered - Start budesonide; continue albuterol when necessary - Strongly encouraged smoking cessation  Sarcoidosis Possibly worsening of underlying sarcoidosis with new hemoptysis.  Continues to smoke.  - Start inhaled  corticosteroids - Obtain 2 view chest x-ray - Keep appointment with pulmonologist next week - See evaluation of hemoptysis for further work-up; will call with results  Seasonal allergies Seasonal allergies associated with nasal congestion, postnasal drip, likely complicating chronic cough - Start Flonase, Zyrtec - Recommend smoking cessation

## 2016-04-02 NOTE — Patient Instructions (Signed)
Your coughing up blood is likely coming from your sarcoidosis and smoking.  - I strongly recommend you quit smoking to prevent further damage to your lungs.   I have order some labs and chest x-ray today to further evaluate your hemoptysis. I will call you with the results.   Start budesonide inhaler twice a day  Start Flonase daily and Zyrtec daily for your allergies  Keep your appointment with your pulmonologist next week  Call or return if you develop ncrease in coughing up blood, chest pain, trouble breathing, or fevers

## 2016-04-02 NOTE — Assessment & Plan Note (Signed)
Seasonal allergies associated with nasal congestion, postnasal drip, likely complicating chronic cough - Start Flonase, Zyrtec - Recommend smoking cessation

## 2016-04-02 NOTE — Assessment & Plan Note (Signed)
Streak hemoptysis less than 1 week associated with chronic cough, continued smoking and sarcoidosis.  No signs of massive bleeding, hypoxia or difficulty breathing. Vitals stable and exam benign. Last PFT wnl - Recommended keeping appointment with pulmonologist next week - Check CBC with differential, CMP, PT/INR, vitamin D, and UA - Chest x-ray ordered - Start budesonide; continue albuterol when necessary - Strongly encouraged smoking cessation

## 2016-04-02 NOTE — Addendum Note (Signed)
Addended by: Jennette BillBUSICK, Nuriya Stuck L on: 04/02/2016 12:14 PM   Modules accepted: Orders

## 2016-04-02 NOTE — Assessment & Plan Note (Addendum)
Possibly worsening of underlying sarcoidosis with new hemoptysis.  Continues to smoke.  - Start inhaled corticosteroids - Obtain 2 view chest x-ray - Keep appointment with pulmonologist next week - See evaluation of hemoptysis for further work-up; will call with results

## 2016-04-03 LAB — PROTIME-INR
INR: 1.12 (ref <1.50)
PROTHROMBIN TIME: 14.5 s (ref 11.6–15.2)

## 2016-04-03 LAB — VITAMIN D 25 HYDROXY (VIT D DEFICIENCY, FRACTURES): VIT D 25 HYDROXY: 9 ng/mL — AB (ref 30–100)

## 2016-04-06 ENCOUNTER — Telehealth: Payer: Self-pay | Admitting: Family Medicine

## 2016-04-06 DIAGNOSIS — E559 Vitamin D deficiency, unspecified: Secondary | ICD-10-CM | POA: Insufficient documentation

## 2016-04-06 MED ORDER — VITAMIN D (ERGOCALCIFEROL) 1.25 MG (50000 UNIT) PO CAPS: 50000.0000 [IU] | ORAL_CAPSULE | ORAL | Status: DC

## 2016-04-06 NOTE — Addendum Note (Signed)
Addended by: Jamal CollinJOYNER, Markevious Ehmke R on: 04/06/2016 03:37 PM   Modules accepted: Orders

## 2016-04-06 NOTE — Telephone Encounter (Signed)
Called and discussed blood work.  Informed her that her vitamin D was low.  Start vitamin D3 50,000 units every weekly for 2-3 months.  Follow-up with PCP in 2-3 months for recheck.  Additionally, she reports no hemoptysis since her visit, she has appointment with her pulmonologist tomorrow.  X-ray showed stable chronic changes of sarcoidosis.  Again recommended smoking cessation

## 2016-04-07 ENCOUNTER — Ambulatory Visit (INDEPENDENT_AMBULATORY_CARE_PROVIDER_SITE_OTHER): Payer: Managed Care, Other (non HMO) | Admitting: Pulmonary Disease

## 2016-04-07 ENCOUNTER — Encounter: Payer: Self-pay | Admitting: Pulmonary Disease

## 2016-04-07 ENCOUNTER — Telehealth: Payer: Self-pay | Admitting: Pulmonary Disease

## 2016-04-07 ENCOUNTER — Telehealth: Payer: Self-pay | Admitting: *Deleted

## 2016-04-07 DIAGNOSIS — Z720 Tobacco use: Secondary | ICD-10-CM | POA: Diagnosis not present

## 2016-04-07 DIAGNOSIS — F17200 Nicotine dependence, unspecified, uncomplicated: Secondary | ICD-10-CM

## 2016-04-07 DIAGNOSIS — D869 Sarcoidosis, unspecified: Secondary | ICD-10-CM

## 2016-04-07 MED ORDER — PREDNISONE 5 MG PO TABS: ORAL_TABLET | ORAL | Status: DC

## 2016-04-07 MED ORDER — ACETAMINOPHEN-CODEINE #3 300-30 MG PO TABS: 1.0000 | ORAL_TABLET | Freq: Three times a day (TID) | ORAL | Status: DC | PRN

## 2016-04-07 NOTE — Telephone Encounter (Signed)
Wanted to let/request the following from Dr. Gayla DossJoyner:  1. Wanted to let Dr. Gayla DossJoyner know that her pulmonologist did not want her to start the inhaled steroid, gave her prednisone instead.  2. Is requesting a cough syrup instead of the tessalon pearls.   Ariel Henson, Ariel RochesterJessica Henson, CMA

## 2016-04-07 NOTE — Assessment & Plan Note (Addendum)
Seems to be a sarcoid flare with systemic symptoms  Take prednisone 10 mg tablets- 2 tablets daily for one week Then 1 tablet x 3 weeks Then 5 mg x 3 weeks-then call back to report  Schedule PFTs  Tylenol No. 3 one tablet every 8 hours when necessary #30  No need to take inhaled steroid

## 2016-04-07 NOTE — Telephone Encounter (Signed)
Spoke with pt and advised that it is best to not inhale ANY chemicals, whether nicotine or others that are used in vapes. She asked about nicotine gum and I advised her to talk with the pharmacist to make sure that she can use one in conjunction with the patch. She voiced understanding. Nothing further needed.

## 2016-04-07 NOTE — Patient Instructions (Signed)
Take prednisone 10 mg tablets- 2 tablets daily for one week Then 1 tablet x 3 weeks Then 5 mg x 3 weeks-then call back to report  Schedule PFTs  Tylenol No. 3 one tablet every 8 hours when necessary #30  No need to take inhaled steroid  Complete smoking cessation

## 2016-04-07 NOTE — Progress Notes (Signed)
   Subjective:    Patient ID: Ariel DadaOdette Alegria, female    DOB: 02-22-1986, 30 y.o.   MRN: [id number]  HPI 30 y.o. smoker for FU of sarcoidosis, presenting as multiple lung masses She works at [employer], since 2014. Denies known beryllium exposure at work .  Started on prednisone 01/2014 - tapered off by 06/2014, again 01/2015 - 05/2015  04/07/2016  Chief Complaint  Patient presents with  . Follow-up    PT is here for a 3 month follow up. Pt c/o SOB due exertion, sinus pressure/drainage, prod cough with occ bloody mucus, and wheezing at times. Rash/lymphs on the back of both legs x 1 month. Denies any chest congestion/tightness, fever, nausea or vomiting.     9338m FU  She reports nasal congestion and heaviness over her face and sinuses-she saw her PCP and was given prescription for inhaled steroid in antihistaminic which she has not filled. She also reports blood streaked sputum on 2 occasions last was 4 days ago and does not recur She reports lumps in her calves which itch and hurt-no skin lesions She continues to smoke about half pack per day She reports occasional right-sided pleuritic chest pain like she had in the past   Off  Prednisone since August/ 2016   Significant tests/ events  CT chest 01/10/14:  Bilateral areas of focal consolidation with a large area of cavitation in the largest right lower lobe consolidative area and small areas of cavitation in a left upper lobe consolidative area.  ANA 1: 40 speckled, ANCA neg, ace level 39 , RA factor and sed rate 39  Quantiferon Gold - (indeterminate)  HIV -NR   CT bx on 01/21/14 (right lung) >>necrotizing granulomatous inflammation, afb cx neg.    PFTs 03/2014  (on 10 mg pred)- no restriction, DLCO nml PFTs 01/2015 - FVC dropped from 98% to 85%, TLC dropped from 85 to 74%, DLCO dropped from 101 to 82%  06/2015 Spirometry FEV1 69% - dropped, with ratio 70 FVC stable at 85%  Spirometry 09/2015  FEV1 72%, ratio 68, FVC  92%  CT chest 01/2016 Stable nodules in airspace consolidation compared to 2016  01/2015 FMLA papers were filled out    Review of Systems neg for any significant sore throat, dysphagia, itching, sneezing, nasal congestion or excess/ purulent secretions, fever, chills, sweats, unintended wt loss, pleuritic or exertional cp, hempoptysis, orthopnea pnd or change in chronic leg swelling. Also denies presyncope, palpitations, heartburn, abdominal pain, nausea, vomiting, diarrhea or change in bowel or urinary habits, dysuria,hematuria, rash, arthralgias, visual complaints, headache, numbness weakness or ataxia.     Objective:   Physical Exam   Gen. Pleasant, thin, in no distress ENT - no lesions, no post nasal drip Neck: No JVD, no thyromegaly, no carotid bruits Lungs: no use of accessory muscles, no dullness to percussion, clear without rales or rhonchi  Cardiovascular: Rhythm regular, heart sounds  normal, no murmurs or gallops, no peripheral edema Musculoskeletal: No deformities, no cyanosis or clubbing         Assessment & Plan:

## 2016-04-07 NOTE — Assessment & Plan Note (Signed)
She will call back if this recurs

## 2016-04-07 NOTE — Addendum Note (Signed)
Addended by: Karalee HeightOX, Kloe Oates P on: 04/07/2016 03:40 PM   Modules accepted: Orders

## 2016-04-07 NOTE — Assessment & Plan Note (Signed)
Complete smoking cessation

## 2016-04-07 NOTE — Telephone Encounter (Signed)
Will forward to MD. Kathlene Yano,CMA  

## 2016-04-08 NOTE — Telephone Encounter (Signed)
Patient also dropped off FMLA forms.  Placed in provider's box for completion. Jazmin Hartsell,CMA

## 2016-04-09 ENCOUNTER — Telehealth: Payer: Self-pay | Admitting: Pulmonary Disease

## 2016-04-09 MED ORDER — HYDROCODONE-HOMATROPINE 5-1.5 MG/5ML PO SYRP: 5.0000 mL | ORAL_SOLUTION | Freq: Four times a day (QID) | ORAL | Status: DC | PRN

## 2016-04-09 NOTE — Telephone Encounter (Signed)
Attempted to contact pt. Her voicemail is full. Will try back.

## 2016-04-09 NOTE — Telephone Encounter (Signed)
I can sign Rx.  Dr Vassie LollAlva will have to address disability question.

## 2016-04-09 NOTE — Telephone Encounter (Signed)
Patient has been out of work since Monday 04/05/16.  She saw Dr. Vassie LollAlva on 04/07/16 and when she went back to work, they suggested that she be placed on STD.  Patient wants to know if she can take a few weeks off because she is not feeling well at all. She is coughing so hard it hurts her ribs, she coughed up some blood this morning (the size of a pebble), she said that she has a rash all over her legs and her PCP thinks it is Sarcoidosis related.   Patient is coughing really hard and has tried OTC cough syrups, but the OTC cough syrups are not working, wants to know if there is something Dr. Vassie LollAlva can prescribe to help with this.  Dr. Vassie LollAlva, please advise.

## 2016-04-09 NOTE — Telephone Encounter (Signed)
What do you recommend for cough?  Tried OTC meds for cough already and nothing is working, please advise.

## 2016-04-09 NOTE — Telephone Encounter (Signed)
Pt is returning call.  

## 2016-04-09 NOTE — Telephone Encounter (Signed)
CY would you be willing to sign rx? Thanks.

## 2016-04-09 NOTE — Telephone Encounter (Signed)
Hycodan cough syrup 5 ML 3 times a day when necessary

## 2016-04-09 NOTE — Telephone Encounter (Signed)
Pt is aware of all the information below. Rx has been left at the front desk for pick up. Nothing further was needed.

## 2016-04-09 NOTE — Telephone Encounter (Signed)
Await PFTs to see if she will qualify

## 2016-04-12 ENCOUNTER — Telehealth: Payer: Self-pay | Admitting: Pulmonary Disease

## 2016-04-12 NOTE — Telephone Encounter (Signed)
FMLA papers received via fax for [employer].  [facility]office mail today.

## 2016-04-12 NOTE — Telephone Encounter (Signed)
Patient states that she is still feeling really bad and has not been able to go back to work yet.  She is still weak and coughing, but says that the cough syrup is helping a little.  Advised her that Dr. Vassie LollAlva wanted to see the results from the PFT before doing STD but she is concerned because she said that her PFT test is not to be done in another 6 weeks and she has not been able to get her strength up to get back to work. She said that she would like to get the STD papers filled out now because she has already missed several days and wants to get paid for the time that she has to be off.  Patient said that she received a fax from [employer] and that she will bring it by the office for Dr. Vassie LollAlva to review.  FYI to Dr. Vassie LollAlva.

## 2016-04-15 NOTE — Telephone Encounter (Signed)
Called and spoke with patient to clarify the FMLA paperwork. This will be faxed today.

## 2016-04-19 NOTE — Telephone Encounter (Signed)
Done - on my desk 2 weeks

## 2016-04-20 NOTE — Telephone Encounter (Signed)
Paperwork has been sent back to [employer] as time sensitive.   Patient aware. Patient scheduled for follow up appointment with Dr. Vassie LollAlva in 2 weeks for return to work visit. Patient aware of appointment.  Nothing further needed.

## 2016-04-22 ENCOUNTER — Other Ambulatory Visit: Payer: Self-pay | Admitting: Orthopedic Surgery

## 2016-04-22 ENCOUNTER — Telehealth: Payer: Self-pay | Admitting: Pulmonary Disease

## 2016-04-22 NOTE — Telephone Encounter (Signed)
Spoke with pt and she states that the start and end dates for her STD are blank. She reports that her first day out of work was 04/05/16. She is scheduled to see RA 05/18/16 for a follow-up/RTW visit. She will take forms back to [employer] so they can forward to our office.   Routing to LivermoreMichelle to be on look out for forms.

## 2016-04-23 NOTE — Telephone Encounter (Signed)
I have forms and placed them on Dr. Reginia NaasAlva's desk.  Dr. Vassie LollAlva states he will come by office on Monday to complete. Will hold until Monday.

## 2016-04-28 NOTE — Telephone Encounter (Signed)
lmtcb x1 for Katie at [facility].

## 2016-04-28 NOTE — Telephone Encounter (Signed)
Forms are on Dr. Reginia NaasAlva's desk to be revised. Awaiting Dr. Vassie LollAlva to sign I will close encounter once completed.

## 2016-04-28 NOTE — Telephone Encounter (Signed)
Marcelino DusterMichelle please advise if RA completed this forms. Thanks.

## 2016-04-28 NOTE — Telephone Encounter (Signed)
Florentina AddisonKatie [phone] calling w/questions about pt disability forms.Caren GriffinsStanley A Dalton

## 2016-04-29 ENCOUNTER — Encounter (HOSPITAL_BASED_OUTPATIENT_CLINIC_OR_DEPARTMENT_OTHER): Payer: Self-pay | Admitting: *Deleted

## 2016-04-29 NOTE — Telephone Encounter (Signed)
Dr. Vassie LollAlva has this paper in his folder with him. Dr. Vassie LollAlva, please respond.

## 2016-04-29 NOTE — Telephone Encounter (Signed)
Disability calling with question that need answering on pt from please advise they can be reached @ [phone] [facility].Caren GriffinsStanley A Dalton

## 2016-04-29 NOTE — Telephone Encounter (Signed)
lmtcb x1 for Katie. 

## 2016-04-30 NOTE — Telephone Encounter (Signed)
Can only write for 2 weeks with her curernt symptoms - she can decide which day to start

## 2016-05-03 ENCOUNTER — Telehealth: Payer: Self-pay | Admitting: Pulmonary Disease

## 2016-05-03 NOTE — Telephone Encounter (Signed)
[phone], pt calling wanting to speak to nurse before [facility] is [facility]

## 2016-05-03 NOTE — Telephone Encounter (Signed)
Form completed by RA  Called spoke with patient who reported that she is ready to return to work but was told by Marcelino DusterMichelle that she would need an appt with RA to complete and she would not be able to be seen until 7.25.17.  Called spoke with [facility] contact [facility] [Katherine Watson @ [phone]] and she stated that a form is not needed, would need to include the date the physician is allowing pt to come back to work, if there are any restrictions (would need specifics) and a tentative end date or if it will be in effect until next appt date > fax to [phone].  Spoke with RA - appt is unnecessary, and okay to generate letter but pt will not need any restrictions.  Letter generated and signed by RA to clear pt to return back to work on 7.11.17 - faxed to [employer].  Spoke with [name] w/ [employer] and informed her that form has been corrected and will fax back to [phone] and [phone] per LaTonya's request. Pt was concerned about the documentation already provided by [employer] not being adequate in showing that pt needed "time for the prednisone to kick in" - [name] was already aware of pt's concern and has already provided the note to [employer].  [employer] form faxed back to [facility].    Nothing further needed; will sign off.

## 2016-05-03 NOTE — Telephone Encounter (Signed)
LMTCB for [facility] [employer]

## 2016-05-03 NOTE — Telephone Encounter (Signed)
Called spoke with pt. She is calling to check status of short term disablity forms. She states she wants her start date to begin on 04/05/16. She also informed me that she sent her copy in and that her work states that the documentation did not support her claim. I explained to her that Shanda BumpsJessica was working with RA to complete these forms. She voiced understanding and had no further questions.   Will forward message to [facility]

## 2016-05-03 NOTE — Telephone Encounter (Signed)
Ariel Henson called checking on status of form. Shanda BumpsJessica obtained form updated form based on Dr. Reginia NaasAlva's instruction and forwarded info to [facility] in [employer] for completion. -prm

## 2016-05-04 ENCOUNTER — Telehealth: Payer: Self-pay | Admitting: Pulmonary Disease

## 2016-05-04 NOTE — Telephone Encounter (Signed)
Taken care of per 7.10.17 phone note

## 2016-05-04 NOTE — Telephone Encounter (Signed)
As documented yesterday, I have already spoken with [name] about pt's disability paperwork.  Unsure what exactly pt is asking for - is she asking for an addendum?  Did she already hear back from her employer and they told her that her disability was denied?  RA did mention yesterday that he could only excuse her for 2 weeks because her lungs sounded clear on exam (this was verbally expressed to pt yesterday when I spoke with her) but the date was extended because pt stated she was told that she would have to wait for an appt with RA before she could be released back to work - so RA extended her time away from work to accommodate for the time she was waiting to be seen.  I have LMOM TCB x1 for LaTonya to see if she can shed any more light on the situation.  Advised her she can ask for triage or myself but I will be with a provider all day tomorrow.

## 2016-05-04 NOTE — Telephone Encounter (Signed)
Ariel Henson, did you take care of this already? Please advise.

## 2016-05-04 NOTE — Telephone Encounter (Signed)
Spoke with pt, states that her disability was denied and she now has 180 days to appeal this.  Pt states that RA did not include enough time off on her paperwork for her FMLA.  Pt became upset that her paperwork was not done in a manner in which she felt was necessary, including "time for her prednisone to kick in".  I advised that I'd look further into this and see what can be done.  Pt spoke with Shanda BumpsJessica regarding this same issue yesterday- I spoke with Jess about this, and we are going to call [employer] to figure out the status of this paperwork.

## 2016-05-05 NOTE — Telephone Encounter (Signed)
Called Ariel Henson [phone]--LMTCB x1  Spoke with pt. She reports her disability paperwork was denied. She was told the reason it was denied was bc it stated pt to be out intermittent and instead of continuously.  Pt needs a letter by RA stating she is to be out continuously from 6/12-7/11 d/t her sarcoidosis and pt needing time for her prednisone to work in her "system". Pt aware RA is on vacation this week. Will forward message to him for when he returns  Please advise thanks

## 2016-05-05 NOTE — Telephone Encounter (Signed)
[name] is returning [place] call regarding the patients paperwork.

## 2016-05-05 NOTE — Telephone Encounter (Signed)
Pt calling back states it was denied due to 1 st question needs it revised and amended       [phone]

## 2016-05-05 NOTE — Telephone Encounter (Signed)
LaTonya returned call >> she verified that pt's claim was denied because question #1 was answered and 'intermittent' rather than 'continuous' for pt's time away from work.  It would make sense that intermittent was in reference to pt missing work intermittently throughout the year for her sarcoidosis, not for this specific time frame.  Per [name], she had spoken with patient right before calling the office and advised pt to go ahead and begin the appeal process - pt had stated she will do so.  Asked [name] for [employer] contact information to find out what exactly will be needed from RA (whether it's a letter or correcting the form with his initials/date) >> [phone], claim # [id number].  Called [employer] and spoke with representative Tiffany to discuss the above.  Tiffany placed me on hold with speak with pt's claim rep assigned to her case, Shawna OrleansMelanie.  Tiffany returned to the phone and reported that Shawna OrleansMelanie stated that unfortunately they cannot provide any direction on what exactly will be needed by this office because the claim was already denied and closed out.  When the pt begins the appeals process then an appeal rep will be assigned to the case and determine what exactly will be needed by RA.  Called spoke with patient and discussed the above.  She agreed to start the appeals process and keep us updated.   Because we are unable to move forward at this time, will go ahead and sign off on message to await to hear something about the appeals process.

## 2016-05-05 NOTE — Telephone Encounter (Signed)
Pt calling to talk to someone about her disability forms. [phone]

## 2016-05-06 ENCOUNTER — Telehealth: Payer: Self-pay | Admitting: Pulmonary Disease

## 2016-05-06 NOTE — Telephone Encounter (Signed)
Pt got update on her disability forms---she stated that she has 10 days to get these forms updated from July 11.  Pt requesting that RA get this done on Monday.  Will forward to RA to make him aware.

## 2016-05-10 NOTE — Telephone Encounter (Signed)
Pt calling again about forms.Ariel GriffinsStanley A Henson

## 2016-05-10 NOTE — Telephone Encounter (Signed)
Spoke with pt.  Short term disability forms need to be turned in no later than 05/14/16.  Pt needs answers changed to question # 1 and # 2.  Question 1 needs to be changed to Yes with RA initial and date at correction.  Question 2 needs to have dates of incapacitation June 12 through July 11 and yes pt is recovered and return to work date July 11 also with RA initial and date at corrections.  [facility] with [employer] to see if she has the forms that need correction and can fax them to us.

## 2016-05-11 NOTE — Telephone Encounter (Signed)
LM for Hartwick SeminaryJanice @ [employer] to try and locate forms

## 2016-05-11 NOTE — Telephone Encounter (Signed)
Have not seen this form 

## 2016-05-12 NOTE — Telephone Encounter (Signed)
Latonya-[employer]  [phone]

## 2016-05-12 NOTE — Telephone Encounter (Signed)
Received form will place in Dr. Reginia NaasAlva's look at folder. Sending message to Dr. Vassie LollAlva.

## 2016-05-12 NOTE — Telephone Encounter (Signed)
Rec'd STD forms from [place] via fax. Given to OdellMichelle to update. -prm

## 2016-05-12 NOTE — Telephone Encounter (Signed)
Spoke with [place]a. I have advised her that we do not have these forms. She is going to refax these forms so that we can make these corrections. Forms are going to be sent to Patrice's attention. Will route to [place].

## 2016-05-13 NOTE — Telephone Encounter (Signed)
Patient calling regarding STD forms-prm

## 2016-05-17 NOTE — Telephone Encounter (Signed)
Rec'd updated disability forms from [place] - faxed to [employer] 05/17/16-pr

## 2016-05-17 NOTE — Telephone Encounter (Signed)
done

## 2016-05-18 ENCOUNTER — Encounter: Payer: Self-pay | Admitting: Pulmonary Disease

## 2016-05-18 ENCOUNTER — Ambulatory Visit (INDEPENDENT_AMBULATORY_CARE_PROVIDER_SITE_OTHER): Payer: Managed Care, Other (non HMO) | Admitting: Pulmonary Disease

## 2016-05-18 DIAGNOSIS — Z720 Tobacco use: Secondary | ICD-10-CM

## 2016-05-18 DIAGNOSIS — F17200 Nicotine dependence, unspecified, uncomplicated: Secondary | ICD-10-CM

## 2016-05-18 DIAGNOSIS — D869 Sarcoidosis, unspecified: Secondary | ICD-10-CM | POA: Diagnosis not present

## 2016-05-18 MED ORDER — PREDNISONE 5 MG PO TABS: 5.0000 mg | ORAL_TABLET | Freq: Every day | ORAL | 1 refills | Status: DC

## 2016-05-18 NOTE — Patient Instructions (Addendum)
Take prednisone 5 mg daily this week In August, decrease prednisone to 5 mg Monday/Wednesday/Friday In September, decrease prednisone to 5 mg Monday/Friday -Can stop in October  Congratulations on quitting smoking  Schedule PFTs

## 2016-05-18 NOTE — Assessment & Plan Note (Signed)
Counselled °

## 2016-05-18 NOTE — Assessment & Plan Note (Signed)
Take prednisone 5 mg daily this week In August, decrease prednisone to 5 mg Monday/Wednesday/Friday In September, decrease prednisone to 5 mg Monday/Friday -Can stop in October  Congratulations on quitting smoking  Schedule PFTs   

## 2016-05-18 NOTE — Progress Notes (Signed)
   Subjective:    Patient ID: Ariel Henson, female    DOB: 31-May-1986, 30 y.o.   MRN: [id number]  HPI  30 y.o. smoker for FU of sarcoidosis, presenting as multiple lung masses She works at [employer], since 2014. Denies known beryllium exposure at work .  Started on prednisone 01/2014 - tapered off by 06/2014, again 01/2015 - 05/2015   05/18/2016  Chief Complaint  Patient presents with  . Follow-up    Needs an addendum sent to her employer in order for her to have Job protection for the time she was out.  The appeal has to be sent in by the 30th of this month.  Pt is doing well.  Rash went away.  She is back at work - started July 11th.      Off  Prednisone since August/ 2016 She developed chest pain and painful lumps in her calves suggestive for sarcoid flare on her last visit in 03/2016. She was given a prednisone course in short term disability papers were filled out  She was cleared to return to work July 11 Her chest pain is now subsided, no new skin lesions she is breathing better She has been able to quit smoking 2 weeks ago   Significant tests/ events  CT chest 01/10/14:  Bilateral areas of focal consolidation with a large area of cavitation in the largest right lower lobe consolidative area and small areas of cavitation in a left upper lobe consolidative area.  ANA 1: 40 speckled, ANCA neg, ace level 39 , RA factor and sed rate 39  Quantiferon Gold - (indeterminate)  HIV -NR   CT bx on 01/21/14 (right lung) >>necrotizing granulomatous inflammation, afb cx neg.    PFTs 03/2014  (on 10 mg pred)- no restriction, DLCO nml PFTs 01/2015 - FVC dropped from 98% to 85%, TLC dropped from 85 to 74%, DLCO dropped from 101 to 82%  06/2015 Spirometry FEV1 69% - dropped, with ratio 70 FVC stable at 85%  Spirometry 09/2015  FEV1 72%, ratio 68, FVC 92%  CT chest 01/2016 Stable nodules in airspace consolidation compared to 2016  01/2015 FMLA papers were filled out    03/2015 short term disability papers    Review of Systems Patient denies significant dyspnea,cough, hemoptysis,  chest pain, palpitations, pedal edema, orthopnea, paroxysmal nocturnal dyspnea, lightheadedness, nausea, vomiting, abdominal or  leg pains      Objective:   Physical Exam  Gen. Pleasant, well-nourished, in no distress ENT - no lesions, no post nasal drip Neck: No JVD, no thyromegaly, no carotid bruits Lungs: no use of accessory muscles, no dullness to percussion, clear without rales or rhonchi  Cardiovascular: Rhythm regular, heart sounds  normal, no murmurs or gallops, no peripheral edema Musculoskeletal: No deformities, no cyanosis or clubbing        Assessment & Plan:

## 2016-05-25 DIAGNOSIS — M67439 Ganglion, unspecified wrist: Secondary | ICD-10-CM

## 2016-05-25 HISTORY — DX: Ganglion, unspecified wrist: M67.439

## 2016-05-26 ENCOUNTER — Telehealth: Payer: Self-pay | Admitting: Pulmonary Disease

## 2016-05-26 NOTE — Telephone Encounter (Signed)
Rec'd release of information ( ok to discuss patient healthcare) authorization from [employer] (company reviewing patient's disability claim. Sent authorization to [employer]. 05/26/16-pr

## 2016-06-03 ENCOUNTER — Encounter (HOSPITAL_BASED_OUTPATIENT_CLINIC_OR_DEPARTMENT_OTHER): Payer: Self-pay | Admitting: *Deleted

## 2016-06-10 ENCOUNTER — Ambulatory Visit (HOSPITAL_BASED_OUTPATIENT_CLINIC_OR_DEPARTMENT_OTHER): Payer: Managed Care, Other (non HMO) | Admitting: Anesthesiology

## 2016-06-10 ENCOUNTER — Encounter (HOSPITAL_BASED_OUTPATIENT_CLINIC_OR_DEPARTMENT_OTHER)
Admission: RE | Disposition: A | Discharge status: Home / Self Care | Payer: Self-pay | Source: Ambulatory Visit | Attending: Orthopedic Surgery

## 2016-06-10 ENCOUNTER — Encounter (HOSPITAL_BASED_OUTPATIENT_CLINIC_OR_DEPARTMENT_OTHER): Payer: Self-pay | Admitting: Certified Registered"

## 2016-06-10 ENCOUNTER — Ambulatory Visit (HOSPITAL_BASED_OUTPATIENT_CLINIC_OR_DEPARTMENT_OTHER)
Admission: RE | Admit: 2016-06-10 | Discharge: 2016-06-10 | Disposition: A | Discharge status: Home / Self Care | Payer: Managed Care, Other (non HMO) | Source: Ambulatory Visit | Attending: Orthopedic Surgery | Admitting: Orthopedic Surgery

## 2016-06-10 DIAGNOSIS — M67432 Ganglion, left wrist: Secondary | ICD-10-CM | POA: Diagnosis not present

## 2016-06-10 DIAGNOSIS — Z87891 Personal history of nicotine dependence: Secondary | ICD-10-CM | POA: Diagnosis not present

## 2016-06-10 DIAGNOSIS — R2232 Localized swelling, mass and lump, left upper limb: Secondary | ICD-10-CM | POA: Diagnosis present

## 2016-06-10 HISTORY — PX: MASS EXCISION: SHX2000

## 2016-06-10 HISTORY — DX: Sarcoidosis, unspecified: D86.9

## 2016-06-10 HISTORY — DX: Ganglion, unspecified wrist: M67.439

## 2016-06-10 SURGERY — EXCISION MASS
Anesthesia: Monitor Anesthesia Care | Site: Wrist | Laterality: Left

## 2016-06-10 MED ORDER — MIDAZOLAM HCL 2 MG/2ML IJ SOLN
1.0000 mg | INTRAMUSCULAR | Status: DC | PRN
  Administered 2016-06-10: 2 mg via INTRAVENOUS

## 2016-06-10 MED ORDER — FENTANYL CITRATE (PF) 100 MCG/2ML IJ SOLN
INTRAMUSCULAR | Status: AC
  Filled 2016-06-10: qty 2

## 2016-06-10 MED ORDER — LIDOCAINE HCL (PF) 0.5 % IJ SOLN
INTRAMUSCULAR | Status: DC | PRN
  Administered 2016-06-10: 25 mL via INTRAVENOUS

## 2016-06-10 MED ORDER — ONDANSETRON HCL 4 MG/2ML IJ SOLN: 4.0000 mg | Freq: Once | INTRAMUSCULAR | Status: DC | PRN

## 2016-06-10 MED ORDER — BUPIVACAINE HCL (PF) 0.25 % IJ SOLN
INTRAMUSCULAR | Status: DC | PRN
  Administered 2016-06-10: 4.5 mL

## 2016-06-10 MED ORDER — MIDAZOLAM HCL 2 MG/2ML IJ SOLN
INTRAMUSCULAR | Status: AC
  Filled 2016-06-10: qty 2

## 2016-06-10 MED ORDER — PROPOFOL 500 MG/50ML IV EMUL
INTRAVENOUS | Status: AC
  Filled 2016-06-10: qty 50

## 2016-06-10 MED ORDER — HYDROCODONE-ACETAMINOPHEN 5-325 MG PO TABS
ORAL_TABLET | ORAL | Status: AC
  Filled 2016-06-10: qty 1

## 2016-06-10 MED ORDER — HYDROCODONE-ACETAMINOPHEN 5-325 MG PO TABS
1.0000 | ORAL_TABLET | Freq: Once | ORAL | Status: AC
  Administered 2016-06-10: 1 via ORAL

## 2016-06-10 MED ORDER — ONDANSETRON HCL 4 MG/2ML IJ SOLN
INTRAMUSCULAR | Status: DC | PRN
  Administered 2016-06-10: 4 mg via INTRAVENOUS

## 2016-06-10 MED ORDER — FENTANYL CITRATE (PF) 100 MCG/2ML IJ SOLN
25.0000 ug | INTRAMUSCULAR | Status: DC | PRN
  Administered 2016-06-10: 50 ug via INTRAVENOUS

## 2016-06-10 MED ORDER — HYDROCODONE-ACETAMINOPHEN 7.5-325 MG PO TABS: 1.0000 | ORAL_TABLET | Freq: Once | ORAL | Status: DC | PRN

## 2016-06-10 MED ORDER — 0.9 % SODIUM CHLORIDE (POUR BTL) OPTIME
TOPICAL | Status: DC | PRN
  Administered 2016-06-10: 200 mL

## 2016-06-10 MED ORDER — ONDANSETRON HCL 4 MG/2ML IJ SOLN
INTRAMUSCULAR | Status: AC
  Filled 2016-06-10: qty 2

## 2016-06-10 MED ORDER — DEXAMETHASONE SODIUM PHOSPHATE 10 MG/ML IJ SOLN
INTRAMUSCULAR | Status: AC
  Filled 2016-06-10: qty 1

## 2016-06-10 MED ORDER — LIDOCAINE HCL (CARDIAC) 20 MG/ML IV SOLN
INTRAVENOUS | Status: DC | PRN
  Administered 2016-06-10: 50 mg via INTRAVENOUS

## 2016-06-10 MED ORDER — LACTATED RINGERS IV SOLN
INTRAVENOUS | Status: DC
  Administered 2016-06-10 (×2): via INTRAVENOUS

## 2016-06-10 MED ORDER — FENTANYL CITRATE (PF) 100 MCG/2ML IJ SOLN
50.0000 ug | INTRAMUSCULAR | Status: DC | PRN
  Administered 2016-06-10: 100 ug via INTRAVENOUS

## 2016-06-10 MED ORDER — CHLORHEXIDINE GLUCONATE 4 % EX LIQD: 60.0000 mL | Freq: Once | CUTANEOUS | Status: DC

## 2016-06-10 MED ORDER — PROPOFOL 10 MG/ML IV BOLUS
INTRAVENOUS | Status: DC | PRN
  Administered 2016-06-10: 20 mg via INTRAVENOUS
  Administered 2016-06-10: 10 mg via INTRAVENOUS

## 2016-06-10 MED ORDER — HYDROCODONE-ACETAMINOPHEN 5-325 MG PO TABS: ORAL_TABLET | ORAL | 0 refills | Status: DC

## 2016-06-10 MED ORDER — GLYCOPYRROLATE 0.2 MG/ML IJ SOLN: 0.2000 mg | Freq: Once | INTRAMUSCULAR | Status: DC | PRN

## 2016-06-10 MED ORDER — CEFAZOLIN SODIUM-DEXTROSE 2-4 GM/100ML-% IV SOLN
2.0000 g | INTRAVENOUS | Status: AC
  Administered 2016-06-10: 2 g via INTRAVENOUS

## 2016-06-10 MED ORDER — SCOPOLAMINE 1 MG/3DAYS TD PT72: 1.0000 | MEDICATED_PATCH | Freq: Once | TRANSDERMAL | Status: DC | PRN

## 2016-06-10 SURGICAL SUPPLY — 53 items
BANDAGE ACE 3X5.8 VEL STRL LF (GAUZE/BANDAGES/DRESSINGS) IMPLANT
BANDAGE COBAN STERILE 2 (GAUZE/BANDAGES/DRESSINGS) IMPLANT
BENZOIN TINCTURE PRP APPL 2/3 (GAUZE/BANDAGES/DRESSINGS) IMPLANT
BLADE MINI RND TIP GREEN BEAV (BLADE) IMPLANT
BLADE SURG 15 STRL LF DISP TIS (BLADE) ×2 IMPLANT
BLADE SURG 15 STRL SS (BLADE) ×4
BNDG COHESIVE 1X5 TAN STRL LF (GAUZE/BANDAGES/DRESSINGS) IMPLANT
BNDG CONFORM 2 STRL LF (GAUZE/BANDAGES/DRESSINGS) IMPLANT
BNDG ELASTIC 2X5.8 VLCR STR LF (GAUZE/BANDAGES/DRESSINGS) IMPLANT
BNDG ESMARK 4X9 LF (GAUZE/BANDAGES/DRESSINGS) IMPLANT
BNDG GAUZE 1X2.1 STRL (MISCELLANEOUS) IMPLANT
BNDG GAUZE ELAST 4 BULKY (GAUZE/BANDAGES/DRESSINGS) ×3 IMPLANT
BNDG PLASTER X FAST 3X3 WHT LF (CAST SUPPLIES) IMPLANT
CHLORAPREP W/TINT 26ML (MISCELLANEOUS) ×3 IMPLANT
CLOSURE WOUND 1/2 X4 (GAUZE/BANDAGES/DRESSINGS)
CORDS BIPOLAR (ELECTRODE) ×3 IMPLANT
COVER BACK TABLE 60X90IN (DRAPES) ×3 IMPLANT
COVER MAYO STAND STRL (DRAPES) ×3 IMPLANT
CUFF TOURNIQUET SINGLE 18IN (TOURNIQUET CUFF) ×3 IMPLANT
DRAPE EXTREMITY T 121X128X90 (DRAPE) ×3 IMPLANT
DRAPE SURG 17X23 STRL (DRAPES) ×3 IMPLANT
DRSG PAD ABDOMINAL 8X10 ST (GAUZE/BANDAGES/DRESSINGS) ×3 IMPLANT
GAUZE SPONGE 4X4 12PLY STRL (GAUZE/BANDAGES/DRESSINGS) ×3 IMPLANT
GAUZE XEROFORM 1X8 LF (GAUZE/BANDAGES/DRESSINGS) ×3 IMPLANT
GLOVE BIO SURGEON STRL SZ7.5 (GLOVE) ×6 IMPLANT
GLOVE BIOGEL PI IND STRL 6.5 (GLOVE) ×1 IMPLANT
GLOVE BIOGEL PI IND STRL 8 (GLOVE) ×2 IMPLANT
GLOVE BIOGEL PI INDICATOR 6.5 (GLOVE) ×2
GLOVE BIOGEL PI INDICATOR 8 (GLOVE) ×4
GOWN STRL REUS W/ TWL LRG LVL3 (GOWN DISPOSABLE) ×1 IMPLANT
GOWN STRL REUS W/TWL LRG LVL3 (GOWN DISPOSABLE) ×2
GOWN STRL REUS W/TWL XL LVL3 (GOWN DISPOSABLE) ×3 IMPLANT
NEEDLE HYPO 25X1 1.5 SAFETY (NEEDLE) ×3 IMPLANT
NS IRRIG 1000ML POUR BTL (IV SOLUTION) ×3 IMPLANT
PACK BASIN DAY SURGERY FS (CUSTOM PROCEDURE TRAY) ×3 IMPLANT
PAD CAST 3X4 CTTN HI CHSV (CAST SUPPLIES) IMPLANT
PAD CAST 4YDX4 CTTN HI CHSV (CAST SUPPLIES) IMPLANT
PADDING CAST ABS 4INX4YD NS (CAST SUPPLIES) ×2
PADDING CAST ABS COTTON 4X4 ST (CAST SUPPLIES) ×1 IMPLANT
PADDING CAST COTTON 3X4 STRL (CAST SUPPLIES)
PADDING CAST COTTON 4X4 STRL (CAST SUPPLIES)
STOCKINETTE 4X48 STRL (DRAPES) ×3 IMPLANT
STRIP CLOSURE SKIN 1/2X4 (GAUZE/BANDAGES/DRESSINGS) IMPLANT
SUT ETHILON 3 0 PS 1 (SUTURE) IMPLANT
SUT ETHILON 4 0 PS 2 18 (SUTURE) ×3 IMPLANT
SUT ETHILON 5 0 P 3 18 (SUTURE)
SUT MON AB 4-0 PC3 18 (SUTURE) ×3 IMPLANT
SUT NYLON ETHILON 5-0 P-3 1X18 (SUTURE) IMPLANT
SUT VIC AB 4-0 P2 18 (SUTURE) ×3 IMPLANT
SYR BULB 3OZ (MISCELLANEOUS) ×3 IMPLANT
SYR CONTROL 10ML LL (SYRINGE) ×3 IMPLANT
TOWEL OR 17X24 6PK STRL BLUE (TOWEL DISPOSABLE) ×6 IMPLANT
UNDERPAD 30X30 (UNDERPADS AND DIAPERS) ×3 IMPLANT

## 2016-06-10 NOTE — Anesthesia Postprocedure Evaluation (Signed)
Anesthesia Post Note  Patient: Ariel Henson  Procedure(s) Performed: Procedure(s) (LRB): LEFT WRIST EXCISION DORSAL GANGLION (Left)  Patient location during evaluation: PACU Anesthesia Type: MAC and Bier Block Level of consciousness: awake and alert Pain management: pain level controlled Vital Signs Assessment: post-procedure vital signs reviewed and stable Respiratory status: spontaneous breathing, nonlabored ventilation, respiratory function stable and patient connected to nasal cannula oxygen Cardiovascular status: stable and blood pressure returned to baseline Anesthetic complications: no    Last Vitals:  Vitals:   06/10/16 1200 06/10/16 1215  BP: 105/69 99/70  Pulse:  60  Resp: 13 11  Temp: 36.4 C     Last Pain:  Vitals:   06/10/16 1200  TempSrc:   PainSc: 9                  Reino KentJudd, Uzair Godley J

## 2016-06-10 NOTE — Op Note (Signed)
NAMLina Sar:  Locatelli,                      ACCOUNT NO.:  [id number]  MEDICAL RECORD NO.:  [id number]  LOCATION:                                 FACILITY:  PHYSICIAN:  Betha LoaKevin Shuan Statzer, MD             DATE OF BIRTH:  DATE OF PROCEDURE:  06/10/2016 DATE OF DISCHARGE:                              OPERATIVE REPORT   PREOPERATIVE DIAGNOSIS:  Left wrist dorsal ganglion.  POSTOPERATIVE DIAGNOSIS:  Left wrist dorsal ganglion.  PROCEDURE:  Excision of left wrist dorsal ganglion.  SURGEON:  Betha LoaKevin Baelyn Doring, MD.  ASSISTANT:  None.  ANESTHESIA:  Bier block with sedation.  IV FLUIDS:  Per anesthesia flow sheet.  ESTIMATED BLOOD LOSS:  Minimal.  COMPLICATIONS:  None.  SPECIMENS:  Cyst to Pathology.  TOURNIQUET TIME:  30 minutes.  DISPOSITION:  Stable to PACU.  INDICATIONS:  Ariel Henson is a 30 year old female, who has noted a mass in the left wrist.  It is bothersome to her.  She wished to have it removed.  Risks, benefits, and alternatives of surgery were discussed including risk of blood loss; infection; damage to nerves, vessels, tendons, ligaments, bone; failure of surgery; need for additional surgery; complications with wound healing; continued pain; and recurrence of mass.  She voiced understanding of these risks and elected to proceed.  OPERATIVE COURSE:  After being identified preoperatively by myself, the patient and I agreed upon procedure and site of procedure.  Surgical site was marked.  The risks, benefits, and alternatives of surgery were reviewed and she wished to proceed.  Surgical consent had been signed. She was given IV Ancef as preoperative antibiotic prophylaxis.  She was transferred to the operating room and placed on the operating room table in supine position with left upper extremity on arm board.  Bier block anesthesia induced by anesthesiologist.  The left upper extremity was prepped and draped in normal sterile orthopedic fashion.  Surgical pause was performed  between surgeons, anesthesia, and operating staff; and all were in agreement as to the patient, procedure, and site of procedure. Tourniquet at the proximal aspect of the forearm had been inflated for the Bier block.  A transverse incision was made over the mass on the dorsal hand, carried into subcutaneous tissues by spreading technique. Bipolar electrocautery was used to obtain hemostasis.  The mass was clear of soft tissue attachments.  The stalk was found coursing toward the dorsum of the carpus.  The cyst was removed and sent to Pathology for examination.  A 4-0 Vicryl suture was used to repair the rent in the capsule.  The wound was copiously irrigated with sterile saline.  A single inverted interrupted Vicryl suture was placed in subcutaneous tissues, and skin was closed with a running subcuticular 4-0 Monocryl suture.  This was augmented with benzoin and Steri-Strips.  The wound was then injected with 5 mL of 0.25% plain Marcaine to aid in postoperative analgesia.  It was dressed with sterile 4x4s and ABD and wrapped with Kerlix and Ace bandage.  Tourniquet was deflated at 30 minutes.  Fingertips were pink with brisk capillary refill after deflation of tourniquet.  Operative drapes were broken down.  The patient was awakened from anesthesia safely.  She was transferred back to stretcher and taken to PACU in stable condition.  I will see her back in the office in 1 week for postoperative followup.  I will give her Norco 5/325, 1-2 p.o. q.6 hours p.r.n. pain, dispensed #20.     Betha LoaKevin Arian Mcquitty, MD     KK/MEDQ  D:  06/10/2016  T:  06/10/2016  Job:  [id number]

## 2016-06-10 NOTE — Transfer of Care (Signed)
Immediate Anesthesia Transfer of Care Note  Patient: Ariel Henson  Procedure(s) Performed: Procedure(s): LEFT WRIST EXCISION DORSAL GANGLION (Left)  Patient Location: PACU  Anesthesia Type:Bier block  Level of Consciousness: awake, alert  and oriented  Airway & Oxygen Therapy: Patient Spontanous Breathing and Patient connected to face mask oxygen  Post-op Assessment: Report given to RN, Post -op Vital signs reviewed and stable and Patient moving all extremities  Post vital signs: Reviewed and stable  Last Vitals:  Vitals:   06/10/16 0956  BP: 106/73  Pulse: 72  Resp: 16  Temp: 36.7 C    Last Pain:  Vitals:   06/10/16 0956  TempSrc: Oral  PainSc:          Complications: No apparent anesthesia complications

## 2016-06-10 NOTE — Discharge Instructions (Addendum)

## 2016-06-10 NOTE — Op Note (Signed)
[id number]

## 2016-06-10 NOTE — Brief Op Note (Signed)
06/10/2016  11:55 AM  PATIENT:  Ariel Henson  30 y.o. female  PRE-OPERATIVE DIAGNOSIS:  Left wrist dorsal ganglion   M67.432  POST-OPERATIVE DIAGNOSIS:  Left wrist dorsal ganglion   M67.432  PROCEDURE:  Procedure(s): LEFT WRIST EXCISION DORSAL GANGLION (Left)  SURGEON:  Surgeon(s) and Role:    * Betha LoaKevin Tayana Shankle, MD - Primary  PHYSICIAN ASSISTANT:   ASSISTANTS: none   ANESTHESIA:   bier block with sedation  EBL:  Total I/O In: 1100 [I.V.:1100] Out: 2 [Blood:2]  BLOOD ADMINISTERED:none  DRAINS: none   LOCAL MEDICATIONS USED:  MARCAINE     SPECIMEN:  Source of Specimen:  left wrist  DISPOSITION OF SPECIMEN:  PATHOLOGY  COUNTS:  YES  TOURNIQUET:   Total Tourniquet Time Documented: Forearm (Left) - 30 minutes Total: Forearm (Left) - 30 minutes   DICTATION: .Other Dictation: Dictation Number [id number]  PLAN OF CARE: Discharge to home after PACU  PATIENT DISPOSITION:  PACU - hemodynamically stable.

## 2016-06-10 NOTE — Anesthesia Preprocedure Evaluation (Signed)
Anesthesia Evaluation  Patient identified by MRN, date of birth, ID band Patient awake    Reviewed: Allergy & Precautions, H&P , NPO status , Patient's Chart, lab work & pertinent test results  History of Anesthesia Complications Negative for: history of anesthetic complications  Airway Mallampati: II  TM Distance: >3 FB Neck ROM: full    Dental no notable dental hx.    Pulmonary former smoker,    Pulmonary exam normal breath sounds clear to auscultation       Cardiovascular negative cardio ROS Normal cardiovascular exam Rhythm:regular Rate:Normal     Neuro/Psych PSYCHIATRIC DISORDERS Depression negative neurological ROS     GI/Hepatic negative GI ROS, Neg liver ROS,   Endo/Other  negative endocrine ROS  Renal/GU negative Renal ROS     Musculoskeletal   Abdominal   Peds  Hematology sarcoidosis   Anesthesia Other Findings sarcoidosis  Reproductive/Obstetrics negative OB ROS                             Anesthesia Physical Anesthesia Plan  ASA: II  Anesthesia Plan: MAC and Bier Block   Post-op Pain Management:    Induction: Intravenous  Airway Management Planned: Simple Face Mask  Additional Equipment:   Intra-op Plan:   Post-operative Plan:   Informed Consent: I have reviewed the patients History and Physical, chart, labs and discussed the procedure including the risks, benefits and alternatives for the proposed anesthesia with the patient or authorized representative who has indicated his/her understanding and acceptance.   Dental Advisory Given  Plan Discussed with: Anesthesiologist, CRNA and Surgeon  Anesthesia Plan Comments:         Anesthesia Quick Evaluation

## 2016-06-10 NOTE — H&P (Signed)
  Ariel Henson is an 30 y.o. female.   Chief Complaint: left wrist mass HPI: 30 yo rhd female with mass on dorsum of wrist for several months.  It is bothersome to her and painful.  She wishes to have it removed.  Allergies:  Allergies  Allergen Reactions  . Pollen Extract Other (See Comments)    RUNNY NOSE, NASAL CONGESTION    Past Medical History:  Diagnosis Date  . Ganglion cyst of wrist 05/2016   left  . Sarcoidosis [facility](HCC)     Past Surgical History:  Procedure Laterality Date  . LUNG BIOPSY  01/21/2014  . WISDOM TOOTH EXTRACTION      Family History: Family History  Problem Relation Age of Onset  . Adopted: Yes  . Family history unknown: Yes    Social History:   reports that she quit smoking about 2 months ago. She has a 0.21 pack-year smoking history. She has never used smokeless tobacco. She reports that she drinks alcohol. She reports that she does not use drugs.  Medications: Medications Prior to Admission  Medication Sig Dispense Refill  . ibuprofen (ADVIL,MOTRIN) 200 MG tablet Take 200 mg by mouth every 6 (six) hours as needed.    . predniSONE (DELTASONE) 5 MG tablet Take 5 mg by mouth 3 (three) times a week. MONDAY/WEDNESDAY/FRIDAY    . albuterol (PROVENTIL HFA;VENTOLIN HFA) 108 (90 Base) MCG/ACT inhaler Inhale into the lungs every 6 (six) hours as needed for wheezing or shortness of breath.      No results found for this or any previous visit (from the past 48 hour(s)).  No results found.   A comprehensive review of systems was negative except for: Constitutional: positive for fatigue Respiratory: positive for cough and dyspnea on exertion  Blood pressure 106/73, pulse 72, temperature 98 F (36.7 C), temperature source Oral, resp. rate 16, height 5\' 4"  (1.626 m), weight 49 kg (108 lb), last menstrual period 05/30/2016, SpO2 98 %.  General appearance: alert, cooperative and appears stated age Head: Normocephalic, without obvious abnormality,  atraumatic Neck: supple, symmetrical, trachea midline Resp: clear to auscultation bilaterally Cardio: regular rate and rhythm GI: non-tender Extremities: Intact sensation and capillary refill all digits.  +epl/fpl/io.  No wounds.  Pulses: 2+ and symmetric Skin: Skin color, texture, turgor normal. No rashes or lesions Neurologic: Grossly normal Incision/Wound:none  Assessment/Plan Left wrist ganglion.  Non operative and operative treatment options were discussed with the patient and patient wishes to proceed with operative treatment. Risks, benefits, and alternatives of surgery were discussed and the patient agrees with the plan of care.   Christoper Bushey R 06/10/2016, 10:53 AM

## 2016-06-11 ENCOUNTER — Encounter (HOSPITAL_BASED_OUTPATIENT_CLINIC_OR_DEPARTMENT_OTHER): Payer: Self-pay | Admitting: Orthopedic Surgery

## 2016-07-04 ENCOUNTER — Encounter (HOSPITAL_BASED_OUTPATIENT_CLINIC_OR_DEPARTMENT_OTHER): Payer: Self-pay | Admitting: Emergency Medicine

## 2016-07-04 ENCOUNTER — Emergency Department (HOSPITAL_BASED_OUTPATIENT_CLINIC_OR_DEPARTMENT_OTHER): Payer: Managed Care, Other (non HMO)

## 2016-07-04 ENCOUNTER — Emergency Department (HOSPITAL_BASED_OUTPATIENT_CLINIC_OR_DEPARTMENT_OTHER)
Admission: EM | Admit: 2016-07-04 | Discharge: 2016-07-04 | Disposition: A | Discharge status: Home / Self Care | Payer: Managed Care, Other (non HMO) | Attending: Physician Assistant | Admitting: Physician Assistant

## 2016-07-04 DIAGNOSIS — Z79899 Other long term (current) drug therapy: Secondary | ICD-10-CM | POA: Diagnosis not present

## 2016-07-04 DIAGNOSIS — Y999 Unspecified external cause status: Secondary | ICD-10-CM | POA: Diagnosis not present

## 2016-07-04 DIAGNOSIS — S6991XA Unspecified injury of right wrist, hand and finger(s), initial encounter: Secondary | ICD-10-CM | POA: Diagnosis present

## 2016-07-04 DIAGNOSIS — Z791 Long term (current) use of non-steroidal anti-inflammatories (NSAID): Secondary | ICD-10-CM | POA: Diagnosis not present

## 2016-07-04 DIAGNOSIS — S60221A Contusion of right hand, initial encounter: Secondary | ICD-10-CM | POA: Diagnosis not present

## 2016-07-04 DIAGNOSIS — Y92410 Unspecified street and highway as the place of occurrence of the external cause: Secondary | ICD-10-CM | POA: Diagnosis not present

## 2016-07-04 DIAGNOSIS — F17210 Nicotine dependence, cigarettes, uncomplicated: Secondary | ICD-10-CM | POA: Diagnosis not present

## 2016-07-04 DIAGNOSIS — Y9355 Activity, bike riding: Secondary | ICD-10-CM | POA: Diagnosis not present

## 2016-07-04 MED ORDER — HYDROCODONE-ACETAMINOPHEN 5-325 MG PO TABS
2.0000 | ORAL_TABLET | Freq: Once | ORAL | Status: AC
  Administered 2016-07-04: 2 via ORAL
  Filled 2016-07-04: qty 2

## 2016-07-04 MED ORDER — HYDROCODONE-ACETAMINOPHEN 5-325 MG PO TABS: 2.0000 | ORAL_TABLET | ORAL | 0 refills | Status: AC | PRN
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
## 2016-07-12 ENCOUNTER — Ambulatory Visit: Payer: Managed Care, Other (non HMO) | Admitting: Pulmonary Disease

## 2016-09-06 ENCOUNTER — Encounter: Payer: Self-pay | Admitting: Pulmonary Disease

## 2016-09-06 ENCOUNTER — Ambulatory Visit (INDEPENDENT_AMBULATORY_CARE_PROVIDER_SITE_OTHER): Payer: Managed Care, Other (non HMO) | Admitting: Pulmonary Disease

## 2016-09-06 DIAGNOSIS — D869 Sarcoidosis, unspecified: Secondary | ICD-10-CM | POA: Diagnosis not present

## 2016-09-06 DIAGNOSIS — F17200 Nicotine dependence, unspecified, uncomplicated: Secondary | ICD-10-CM | POA: Diagnosis not present

## 2016-09-06 LAB — PULMONARY FUNCTION TEST
DL/VA % PRED: 114 %
DL/VA: 5.5 ml/min/mmHg/L
DLCO cor % pred: 79 %
DLCO cor: 19.31 ml/min/mmHg
DLCO unc % pred: 89 %
DLCO unc: 21.67 ml/min/mmHg
FEF 25-75 PRE: 1.54 L/s
FEF 25-75 Post: 1.61 L/sec
FEF2575-%CHANGE-POST: 4 %
FEF2575-%Pred-Post: 49 %
FEF2575-%Pred-Pre: 47 %
FEV1-%CHANGE-POST: 2 %
FEV1-%PRED-PRE: 76 %
FEV1-%Pred-Post: 77 %
FEV1-POST: 2.16 L
FEV1-PRE: 2.11 L
FEV1FVC-%CHANGE-POST: 1 %
FEV1FVC-%Pred-Pre: 85 %
FEV6-%CHANGE-POST: 1 %
FEV6-%PRED-POST: 90 %
FEV6-%PRED-PRE: 89 %
FEV6-PRE: 2.86 L
FEV6-Post: 2.89 L
FEV6FVC-%PRED-PRE: 101 %
FEV6FVC-%Pred-Post: 101 %
FVC-%Change-Post: 1 %
FVC-%PRED-POST: 89 %
FVC-%Pred-Pre: 88 %
FVC-Post: 2.89 L
FVC-Pre: 2.86 L
POST FEV1/FVC RATIO: 75 %
POST FEV6/FVC RATIO: 100 %
PRE FEV6/FVC RATIO: 100 %
Pre FEV1/FVC ratio: 74 %
RV % PRED: 85 %
RV: 1.15 L
TLC % PRED: 76 %
TLC: 3.84 L

## 2016-09-06 NOTE — Assessment & Plan Note (Signed)
Her lung function is stable Okay to stay off prednisone She will call us if she develops skin lesions are worsening dyspnea or chest pain

## 2016-09-06 NOTE — Assessment & Plan Note (Signed)
I complimented her on smoking cessation. She will continue to use nicotine gum for breakthrough urge

## 2016-09-06 NOTE — Patient Instructions (Signed)
Your lung function is stable Congratulations on quitting smoking

## 2016-09-06 NOTE — Progress Notes (Addendum)
   Subjective:    Patient ID: Ariel Henson, female    DOB: 1986-01-05, 30 y.o.   MRN: [id number]  HPI   30 y.o. smoker for FU of sarcoidosis, presenting as multiple lung masses She works at [employer], since 2014.Denies known beryllium exposure at work .  Started on prednisone 01/2014 - tapered off by 06/2014, again 01/2015 - 05/2015   09/06/2016  Chief Complaint  Patient presents with  . Follow-up    Had her PFT today, Pt. states her breathing is doing ok, some wheezing, some coughing but not able to produce anything   She has quit smoking since 04/2016 She is off prednisone now for about a month No new skin lesions, breathing is stable, chest pain has subsided She was cleared to return to work July 11 She has gained about 8 pounds PFTs were reviewed    Significant tests/ events  CT chest 01/10/14: Bilateral areas of focal consolidation with a large area of cavitation in the largest right lower lobe consolidative area and small areas of cavitation in a left upper lobe consolidative area.  ANA 1: 40 speckled, ANCA neg, ace level 39 , RA factor and sed rate 39  Quantiferon Gold - (indeterminate)  HIV -NR   CT bx on 01/21/14 (right lung) >>necrotizing granulomatous inflammation, afb cx neg.    PFTs 03/2014 (on 10 mg pred)- no restriction, DLCO nml PFTs 01/2015 - FVC dropped from 98% to 85%, TLC dropped from 85 to 74%, DLCO dropped from 101 to 82%  06/2015 Spirometry FEV1 69% - dropped, with ratio 70 FVC stable at 85%  PFTs 08/2016 FVC is maintained at 88%, TLC 76%, DLCO 89%  CT chest 01/2016 Stable nodules in airspace consolidation compared to 2016  01/2015 FMLA papers were filled out   03/2015 short term disability papers    Review of Systems Patient denies significant dyspnea,cough, hemoptysis,  chest pain, palpitations, pedal edema, orthopnea, paroxysmal nocturnal dyspnea, lightheadedness, nausea, vomiting, abdominal or  leg pains        Objective:   Physical Exam  Gen. Pleasant, well-nourished, in no distress ENT - no lesions, no post nasal drip Neck: No JVD, no thyromegaly, no carotid bruits Lungs: no use of accessory muscles, no dullness to percussion, clear without rales or rhonchi  Cardiovascular: Rhythm regular, heart sounds  normal, no murmurs or gallops, no peripheral edema Musculoskeletal: No deformities, no cyanosis or clubbing        Assessment & Plan:

## 2016-09-29 ENCOUNTER — Other Ambulatory Visit: Payer: Self-pay | Admitting: Emergency Medicine

## 2016-09-29 DIAGNOSIS — R52 Pain, unspecified: Secondary | ICD-10-CM

## 2016-09-30 ENCOUNTER — Other Ambulatory Visit: Payer: Self-pay | Admitting: Emergency Medicine

## 2016-09-30 DIAGNOSIS — Z77018 Contact with and (suspected) exposure to other hazardous metals: Secondary | ICD-10-CM

## 2016-10-12 ENCOUNTER — Ambulatory Visit
Admission: RE | Admit: 2016-10-12 | Discharge: 2016-10-12 | Disposition: A | Discharge status: Home / Self Care | Payer: Managed Care, Other (non HMO) | Source: Ambulatory Visit | Attending: Emergency Medicine | Admitting: Emergency Medicine

## 2016-10-12 DIAGNOSIS — Z77018 Contact with and (suspected) exposure to other hazardous metals: Secondary | ICD-10-CM

## 2016-10-12 DIAGNOSIS — R52 Pain, unspecified: Secondary | ICD-10-CM

## 2016-10-15 ENCOUNTER — Other Ambulatory Visit: Payer: Self-pay | Admitting: Orthopedic Surgery

## 2016-10-19 ENCOUNTER — Other Ambulatory Visit: Payer: Self-pay | Admitting: Orthopedic Surgery

## 2016-10-19 DIAGNOSIS — S6991XD Unspecified injury of right wrist, hand and finger(s), subsequent encounter: Secondary | ICD-10-CM

## 2016-10-19 DIAGNOSIS — S6391XD Sprain of unspecified part of right wrist and hand, subsequent encounter: Secondary | ICD-10-CM

## 2016-11-02 ENCOUNTER — Ambulatory Visit
Admission: RE | Admit: 2016-11-02 | Discharge: 2016-11-02 | Disposition: A | Discharge status: Home / Self Care | Payer: Managed Care, Other (non HMO) | Source: Ambulatory Visit | Attending: Orthopedic Surgery | Admitting: Orthopedic Surgery

## 2016-11-02 ENCOUNTER — Ambulatory Visit
Admission: RE | Admit: 2016-11-02 | Discharge: 2016-11-02 | Disposition: A | Discharge status: Home / Self Care | Payer: Commercial Managed Care - PPO | Source: Ambulatory Visit | Attending: Orthopedic Surgery | Admitting: Orthopedic Surgery

## 2016-11-02 ENCOUNTER — Other Ambulatory Visit: Payer: Self-pay | Admitting: Orthopedic Surgery

## 2016-11-02 DIAGNOSIS — S6991XD Unspecified injury of right wrist, hand and finger(s), subsequent encounter: Secondary | ICD-10-CM

## 2016-11-02 MED ORDER — IOPAMIDOL (ISOVUE-M 200) INJECTION 41%
3.0000 mL | Freq: Once | INTRAMUSCULAR | Status: AC
  Administered 2016-11-02: 3 mL via INTRA_ARTICULAR
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
## 2017-03-24 ENCOUNTER — Ambulatory Visit (INDEPENDENT_AMBULATORY_CARE_PROVIDER_SITE_OTHER): Payer: Commercial Managed Care - PPO | Admitting: Family Medicine

## 2017-03-24 ENCOUNTER — Encounter: Payer: Self-pay | Admitting: Family Medicine

## 2017-03-24 VITALS — BP 98/52 | HR 83 | Temp 98.1°F | Ht 64.0 in | Wt 110.0 lb

## 2017-03-24 DIAGNOSIS — K2900 Acute gastritis without bleeding: Secondary | ICD-10-CM

## 2017-03-24 DIAGNOSIS — R102 Pelvic and perineal pain: Secondary | ICD-10-CM | POA: Diagnosis not present

## 2017-03-24 DIAGNOSIS — K2970 Gastritis, unspecified, without bleeding: Secondary | ICD-10-CM | POA: Insufficient documentation

## 2017-03-24 LAB — POCT URINALYSIS DIP (MANUAL ENTRY)
BILIRUBIN UA: NEGATIVE
BILIRUBIN UA: NEGATIVE mg/dL
Blood, UA: NEGATIVE
GLUCOSE UA: NEGATIVE mg/dL
LEUKOCYTES UA: NEGATIVE
Nitrite, UA: NEGATIVE
PROTEIN UA: NEGATIVE mg/dL
Spec Grav, UA: 1.025 (ref 1.010–1.025)
Urobilinogen, UA: 1 E.U./dL
pH, UA: 6.5 (ref 5.0–8.0)

## 2017-03-24 MED ORDER — OMEPRAZOLE 20 MG PO CPDR: 20.0000 mg | DELAYED_RELEASE_CAPSULE | Freq: Every day | ORAL | 1 refills | Status: AC

## 2017-03-24 NOTE — Assessment & Plan Note (Signed)
Patient here w/ sxs of gastritis. Underlying etiology unknown at this time. DDx includes infectious (viral) vs irritant (EtOH) vs stress. Patient no longer smokes, no NSAID use, no caffeine. Some suprapubic pain on exam made UTI a possibility. UA unremarkable.  - Trial of PPI: omeprazole 20mg  x 1 month - avoidance of irritants. - return precautions discussed. - f/u 1 month if sxs persist/worsen

## 2017-03-24 NOTE — Progress Notes (Signed)
   HPI  CC: ABDOMINAL PAIN Started Monday morning. Seemed to get worse on Tuesday. A lot of belching, vomiting 1-2 on Mon and Tuesday. No vomiting yesterday. Never had anything like this in the past. Better today. No diarrhea.  Medications tried: Tried Pepto-Bismol (no help) Similar pain before: no Prior abdominal surgeries: no  Symptoms Nausea/vomiting: yes Diarrhea: no Constipation: no; 1-2 BMs in a typical day (no recent change) Blood in stool: no Blood in vomit: no Fever: no Dysuria: no Loss of appetite: some; eating increases nausea Weight loss: no  Vaginal Bleeding: LMP: May 13th; Uses Condoms regularly; home preg test negative Missed menstrual period: no  Social:  - No coffee - Soda intake: 1 "sprite" a day - EtOH: 4 days of 7; 2-3 mixed drinks each session; last drink was Monday night - No new stressors reported - No NSAID use - Former smoker: quit last year  Review of Symptoms - see HPI  Objective: BP (!) 98/52   Pulse 83   Temp 98.1 F (36.7 C) (Oral)   Ht 5\' 4"  (1.626 m)   Wt 110 lb (49.9 kg)   LMP 03/06/2017   SpO2 98%   BMI 18.88 kg/m  Gen: NAD, alert, cooperative, and pleasant. Well appearing. MMM CV: RRR, no murmur Resp: CTAB, no wheezes, non-labored Abd: S, tenderness noted at the epigastrium (greatest), LLQ, RLQ, and suprapubic region. ND, BS unremarkable, no guarding or organomegaly, Murphy's negative, obturator/psoas sign negative.   Assessment and plan:  Gastritis Patient here w/ sxs of gastritis. Underlying etiology unknown at this time. DDx includes infectious (viral) vs irritant (EtOH) vs stress. Patient no longer smokes, no NSAID use, no caffeine. Some suprapubic pain on exam made UTI a possibility. UA unremarkable.  - Trial of PPI: omeprazole 20mg  x 1 month - avoidance of irritants. - return precautions discussed. - f/u 1 month if sxs persist/worsen   Orders Placed This Encounter  Procedures  . POCT urinalysis dipstick    Meds  ordered this encounter  Medications  . omeprazole (PRILOSEC) 20 MG capsule    Sig: Take 1 capsule (20 mg total) by mouth daily.    Dispense:  30 capsule    Refill:  1     Kathee DeltonIan D Deneisha Dade, MD,MS,  PGY3 03/24/2017 5:28 PM

## 2017-03-24 NOTE — Patient Instructions (Addendum)
It was a pleasure seeing you today in our clinic. Today we discussed your abdominal pain. Here is the treatment plan we have discussed and agreed upon together:   - Urine sample today looked clean. I do not think there is any risk of infection. - The pain you are experiencing is likely due to "gastritis". - I have prescribed you omeprazole. Take 1 tablet once a day for the next month. - If you notice any bloody stools, black stools, or worsening abdominal pain do not hesitate to come back for reevaluation. - If your symptoms persist beyond 30 days come back for reevaluation.

## 2017-05-03 ENCOUNTER — Telehealth: Payer: Self-pay | Admitting: Pulmonary Disease

## 2017-05-03 NOTE — Telephone Encounter (Signed)
Will await FMLA paperwork 

## 2017-05-04 ENCOUNTER — Other Ambulatory Visit (INDEPENDENT_AMBULATORY_CARE_PROVIDER_SITE_OTHER): Payer: Commercial Managed Care - PPO

## 2017-05-04 ENCOUNTER — Encounter: Payer: Self-pay | Admitting: Pulmonary Disease

## 2017-05-04 ENCOUNTER — Ambulatory Visit (INDEPENDENT_AMBULATORY_CARE_PROVIDER_SITE_OTHER)
Admission: RE | Admit: 2017-05-04 | Discharge: 2017-05-04 | Disposition: A | Discharge status: Home / Self Care | Payer: Commercial Managed Care - PPO | Source: Ambulatory Visit | Attending: Pulmonary Disease | Admitting: Pulmonary Disease

## 2017-05-04 ENCOUNTER — Ambulatory Visit (INDEPENDENT_AMBULATORY_CARE_PROVIDER_SITE_OTHER): Payer: Commercial Managed Care - PPO | Admitting: Pulmonary Disease

## 2017-05-04 VITALS — BP 96/70 | HR 96 | Ht 64.0 in | Wt 110.4 lb

## 2017-05-04 DIAGNOSIS — R091 Pleurisy: Secondary | ICD-10-CM

## 2017-05-04 DIAGNOSIS — D869 Sarcoidosis, unspecified: Secondary | ICD-10-CM

## 2017-05-04 LAB — COMPREHENSIVE METABOLIC PANEL
ALBUMIN: 4.2 g/dL (ref 3.5–5.2)
ALT: 8 U/L (ref 0–35)
AST: 17 U/L (ref 0–37)
Alkaline Phosphatase: 49 U/L (ref 39–117)
BUN: 8 mg/dL (ref 6–23)
CALCIUM: 9.3 mg/dL (ref 8.4–10.5)
CHLORIDE: 102 meq/L (ref 96–112)
CO2: 28 meq/L (ref 19–32)
Creatinine, Ser: 0.82 mg/dL (ref 0.40–1.20)
GFR: 105.89 mL/min (ref >60.00)
Glucose, Bld: 103 mg/dL — ABNORMAL HIGH (ref 70–99)
POTASSIUM: 4 meq/L (ref 3.5–5.1)
Sodium: 137 mEq/L (ref 135–145)
Total Bilirubin: 0.8 mg/dL (ref 0.2–1.2)
Total Protein: 7.6 g/dL (ref 6.0–8.3)

## 2017-05-04 LAB — CBC WITH DIFFERENTIAL/PLATELET
BASOS ABS: 0 10*3/uL (ref 0.0–0.1)
Basophils Relative: 0.8 % (ref 0.0–3.0)
EOS PCT: 12.3 % — AB (ref 0.0–5.0)
Eosinophils Absolute: 0.6 10*3/uL (ref 0.0–0.7)
HCT: 40.4 % (ref 36.0–46.0)
Hemoglobin: 14 g/dL (ref 12.0–15.0)
Lymphocytes Relative: 22.4 % (ref 12.0–46.0)
Lymphs Abs: 1.1 10*3/uL (ref 0.7–4.0)
MCHC: 34.6 g/dL (ref 30.0–36.0)
MCV: 99.5 fl (ref 78.0–100.0)
MONO ABS: 0.3 10*3/uL (ref 0.1–1.0)
MONOS PCT: 6.4 % (ref 3.0–12.0)
NEUTROS ABS: 2.9 10*3/uL (ref 1.4–7.7)
Neutrophils Relative %: 58.1 % (ref 43.0–77.0)
PLATELETS: 244 10*3/uL (ref 150.0–400.0)
RBC: 4.06 Mil/uL (ref 3.87–5.11)
RDW: 12.2 % (ref 11.5–15.5)
WBC: 5 10*3/uL (ref 4.0–10.5)

## 2017-05-04 LAB — SEDIMENTATION RATE: SED RATE: 12 mm/h (ref 0–20)

## 2017-05-04 LAB — C-REACTIVE PROTEIN: CRP: 0.4 mg/dL — ABNORMAL LOW (ref 0.5–20.0)

## 2017-05-04 MED ORDER — PREDNISONE 10 MG PO TABS: 10.0000 mg | ORAL_TABLET | Freq: Every day | ORAL | 2 refills | Status: AC

## 2017-05-04 NOTE — Progress Notes (Signed)
Subjective:    Patient ID: Ariel Henson, female    DOB: 1986-07-20, 31 y.o.   MRN: [id number]  C.C.:  Acute visit with known Sarcoidosis.  HPI Last course of prednisone was in 2017 after a flare of her sarcoidosis. No known beryllium exposure. She reports she has noticed increasing nodularity for about 1 month. Yesterday she felt significant fatigue. She has noticed increased left ankle pain, particularly around the medial malleolus with slightly increased swelling. She reports intense pain with scratching or striking the nodules of her skin. She denies any change in her baseline dyspnea. She reports previously she had wheezing that resolved, preceding her appointment in November 2017, and has had non since. She does report some pleurisy on her left side with deep inspiration. She denies any coughing. She has had no Prednisone since 2017 per her report. No persistent headaches or focal weakness, numbness, or tingling. No other joint swelling or erythema. No recent travel.   Review of Systems No fever, chills, or sweats. No eye swelling, pain, erythema, or blurry vision. Na palpitations, syncope or near syncope.  Allergies  Allergen Reactions  . Pollen Extract Other (See Comments)    RUNNY NOSE, NASAL CONGESTION    Current Outpatient Prescriptions on File Prior to Visit  Medication Sig Dispense Refill  . albuterol (PROVENTIL HFA;VENTOLIN HFA) 108 (90 Base) MCG/ACT inhaler Inhale into the lungs every 6 (six) hours as needed for wheezing or shortness of breath.    Marland Kitchen HYDROcodone-acetaminophen (NORCO/VICODIN) 5-325 MG tablet Take 2 tablets by mouth every 4 (four) hours as needed. (Patient not taking: Reported on 05/04/2017) 16 tablet 0  . omeprazole (PRILOSEC) 20 MG capsule Take 1 capsule (20 mg total) by mouth daily. (Patient not taking: Reported on 05/04/2017) 30 capsule 1   No current facility-administered medications on file prior to visit.     Past Medical History:  Diagnosis Date  .  Allergic rhinitis   . Depression   . Ganglion cyst of wrist 05/2016   left  . Sarcoidosis     Past Surgical History:  Procedure Laterality Date  . GANGLION CYST EXCISION    . LUNG BIOPSY  01/21/2014  . MASS EXCISION Left 06/10/2016   Procedure: LEFT WRIST EXCISION DORSAL GANGLION;  Surgeon: Leanora Cover, MD;  Location: [facility];  Service: Orthopedics;  Laterality: Left;  . WISDOM TOOTH EXTRACTION      Family History  Problem Relation Age of Onset  . Adopted: Yes  . Family history unknown: Yes    Social History   Social History  . Marital status: Single    Spouse name: N/A  . Number of children: N/A  . Years of education: N/A   Social History Main Topics  . Smoking status: Former Smoker    Packs/day: 1.00    Years: 9.00    Types: Cigarettes    Quit date: 04/07/2016  . Smokeless tobacco: Never Used  . Alcohol use 0.0 oz/week     Comment: occasionally  . Drug use: No  . Sexual activity: Yes    Birth control/ protection: None   Other Topics Concern  . None   Social History Narrative   Pt works at [employer] (05/04/17):   Previously born in [place] but lived in [place]. Moved to [place] in 2009. Currently works for [employer]. Has a dog at home currently. No bird exposure. She reports in her first apartment she had mold and  lived there for 5 years (2012-2017). No hot tub exposure. No hobbies currently other than playing pool.       Objective:   Physical Exam BP 96/70 (BP Location: Left Arm, Patient Position: Sitting, Cuff Size: Normal)   Pulse 96   Ht 5\' 4"  (1.626 m)   Wt 110 lb 6.4 oz (50.1 kg)   SpO2 100%   BMI 18.95 kg/m  General:  Awake. Alert. No acute distress. Thin, African-American female. Integument:  Warm & dry. Minimal subcutaneous nodules on bilateral shins. Subcentimeter patchy macules present on bilateral lower extremities. Extremities:  No cyanosis or clubbing.  HEENT:  Moist mucus membranes. No oral  ulcers. No scleral injection or icterus. Minimal nasal turbinate swelling. Cardiovascular:  Regular rate and rhythm. No edema. Normal S1 & S2.  Pulmonary:  Good aeration & clear to auscultation bilaterally. Symmetric chest wall expansion. No accessory muscle use on room air. Abdomen: Soft. Normal bowel sounds. Nondistended.  Musculoskeletal:  Normal bulk and tone. Hand grip strength 4+/5 bilaterally. No joint deformity appreciated. Minimal swelling around left medial malleolus.  PFT 09/06/16: FVC 2.86 L (80%) FEV1 2.11 L (76%) FEV1/FVC 0.74 FEF 25-75 1.5 L (47%) negative bronchodilator response TLC 3.84 L (76%) RV 85% ERV 141% DLCO corrected 79% 04/22/14: FVC 3.21 L (98%) FEV1 2.60 L (92%) FEV1/FVC 0.81 FEF 25-75 2.41 L (71%) negative bronchodilator response TLC 4.30 L (85%) RV 96% ERV 112% DLCO uncorrected 101%  CARDIAC EKG 05/04/17 (personally reviewed by me):  Normal sinus rhythm with QT 362 ms. Somewhat low voltage across the precordial leads. Underlying slight distortion from artifact but no obvious conduction abnormality.  PATHOLOGY CT-Guided FNA Right Lower Lobe (01/21/17):  Necrotizing Granulomatous Inflammation. Stains negative for AFB & Fungus.   MICROBIOLOGY QuantiFERON-TB (01/10/14): Indeterminate AFB culture left lung (01/21/14): Negative  LABS 02/18/15 ANA:  Negative C-ANCA:  Negative P-ANCA:  Negative Aytpical P-ANCA:  Negative  02/05/15 ACE:  60  01/11/14 ACE:  39 ANA:  1:40 (speckled) C-ANCA:  Negative P-ANCA:  Negative Aytpical P-ANCA:  Negative    Assessment & Plan:  31 y.o. female with known sarcoidosis. Patient is having  pleurisy which would be atypical for sarcoidosis. Reviewing patient's previous pathology did show necrotizing granulomatous inflammation. Given her previous residence in [place] as well as [place] reactivation of a fungal disease process is certainly possible. Additionally, reviewing her pulmonary function testing from 2015 through 2017 does  show a decline greater than what would be expected for normal physiologic rates. With her skin findings I do believe it's highly probable she is having a flare of her sarcoidosis, especially as she has responded to treatment in the past utilizing prednisone. Given her previous indeterminate QuantiFERON-TB I'm hesitant to initiate and do not believe that immunosuppression is necessary at this time with methotrexate. Instead, I am starting prednisone therapy while investigating other potential infectious etiologies as well as autoimmune pathologies that could account for her constellation of symptoms and biopsy results. I instructed the patient to contact our office if she had any new breathing problems or questions before next appointment.  1. Sarcoidosis:  Starting prednisone 10 mg by mouth daily. Checking chest x-ray PA/LAT today. Checking full pulmonary function testing and 6 minute walk test on room air at next appointment. Checking serum CBC, CMP, and autoimmune workup. Also checking urine Histoplasma and Blastomyces antigens. 2. Pleurisy:  Checking chest x-ray PA/LAT today. Checking serum ESR & CRP. 3. Follow-up: Return to clinic in2 weeks to be seen by any available provider.  Sonia Baller Ashok Cordia, M.D. [facility] Pulmonary & Critical Care Pager:  [phone] After 3pm or if no response, call [phone] 3:17 PM 05/04/17

## 2017-05-04 NOTE — Patient Instructions (Addendum)
   Call or let us know if you feel like you are not getting better after a few days.  We will see you back in a couple of weeks to make sure you're improving.  Make sure you make an appointment with an opthalmologist to ensure you don't have eye involvement of your sarcoidosis .   TESTS ORDERED: 1. CXR PA/LAT today 2. Full PFTs at next appointment 3. 6MWT on room air at next appointment 4. Serum CBC with differential, CMP, ANA, ACE, ESR, CRP, Smith Antibody, DS DNA Antibody, Sjogrens Panel, Anti-CCP, Rheumatoid Factor. 5. Urine Histoplasma & Blastomyces Antigens  

## 2017-05-05 ENCOUNTER — Telehealth: Payer: Self-pay | Admitting: Pulmonary Disease

## 2017-05-05 LAB — ANGIOTENSIN CONVERTING ENZYME: Angiotensin-Converting Enzyme: 21 U/L (ref 9–67)

## 2017-05-05 LAB — SJOGREN'S SYNDROME ANTIBODS(SSA + SSB)
SSA (Ro) (ENA) Antibody, IgG: <1
SSB (La) (ENA) Antibody, IgG: <1

## 2017-05-05 LAB — RHEUMATOID FACTOR: Rhuematoid fact SerPl-aCnc: 27 IU/mL — ABNORMAL HIGH (ref <14)

## 2017-05-05 LAB — ANA: Anti Nuclear Antibody(ANA): NEGATIVE

## 2017-05-05 LAB — CYCLIC CITRUL PEPTIDE ANTIBODY, IGG: Cyclic Citrullin Peptide Ab: <16 Units

## 2017-05-05 NOTE — Progress Notes (Signed)
Spoke with pt about results from cxr. Pt expressed understanding. Nothing further needed at this time.

## 2017-05-05 NOTE — Telephone Encounter (Signed)
She was an acute visit. I would check with Dr. Vassie LollAlva and see if he wants to try to work her in sooner or defer her appointment. Does he have anything open in that timeframe? Thanks.

## 2017-05-05 NOTE — Telephone Encounter (Signed)
JN please advise. Can pt be scheduled in the next available appointment?

## 2017-05-05 NOTE — Telephone Encounter (Signed)
RA  Please see previous messages

## 2017-05-06 NOTE — Telephone Encounter (Signed)
Place on cancellation list for me -  Ok to get NP appt over next 4 weeks

## 2017-05-06 NOTE — Telephone Encounter (Signed)
She has been placed on recall list.

## 2017-05-08 LAB — MVISTA BLASTOMYCES QNT AG, URINE: INTERPRETATION - MVISTA BLASTOMYCES QNT AG: NEGATIVE

## 2017-05-08 LAB — HISTOPLASMA ANTIGEN, URINE

## 2017-05-19 ENCOUNTER — Telehealth: Payer: Self-pay | Admitting: Pulmonary Disease

## 2017-05-19 NOTE — Telephone Encounter (Signed)
Spoke with pt and informed her that Sharlynn OliphantJess was seeing pt and if there was something I could do or get further information about the paperwork but she states she wants to talk to Jess directly  Forwarding the message to Jess to contact pt

## 2017-05-20 NOTE — Telephone Encounter (Signed)
LMOM TCB x1 for pt Informed patient that she may speak with anyone else within the office and they can relay the message or help her in any way that she needs

## 2017-05-20 NOTE — Telephone Encounter (Signed)
Pt returning call again.Ariel Henson ° °

## 2017-05-20 NOTE — Telephone Encounter (Signed)
LMOM TCB x1 for patient 

## 2017-05-20 NOTE — Telephone Encounter (Signed)
Pt return call.Ariel Henson ° °

## 2017-05-20 NOTE — Telephone Encounter (Signed)
Spoke pt, who states she is currently dropping her FMLA paperwork off at medical records. Pt is requesting that FMLA paperwork be complete by 05/24/17 in order for her leave to be approved. I have explained the process of FMLA to pt and explained that it can take several days for RA to complete this paperwork.  Pt voiced her understanding. Nothing further needed at this time.

## 2017-05-24 ENCOUNTER — Telehealth: Payer: Self-pay | Admitting: Pulmonary Disease

## 2017-05-24 NOTE — Telephone Encounter (Signed)
Pt calling back a/b FMLA forms need them completed ASAP and needs a call back from nurse today, she can be reached back @ [phone].Caren GriffinsStanley A Dalton

## 2017-05-24 NOTE — Telephone Encounter (Signed)
Called and spoke to pt. Pt states she contacted [facility] and they informed her that we could fax the last OV note and that would help extend the due date of her FMLA forms. Pt states she does not have the fax number but will call back and get the fax number. When pt calls back with fax number [facility] we need to send last OV note today 05/24/17.

## 2017-05-24 NOTE — Telephone Encounter (Signed)
Fax [facility] to [facility] [phone].

## 2017-05-24 NOTE — Telephone Encounter (Addendum)
Called and spoke to pt. Pt is request the disability forms completed asap as they are due today 05/24/17. Pt states she just signed the release form for medical records on 05/23/17 and was told we would receive the forms today. Advised pt that once we get the forms we will complete them as soon as we can. We have not received the disability forms yet. Pt states she will call and see if she can get an extension on the forms and will call back with an update.

## 2017-05-24 NOTE — Telephone Encounter (Signed)
OV note from 05/04/17 [facility] at provided fax number. Received successful fax confirmation. Pt is aware that OV [facility].  Nothing further needed at this time.

## 2017-05-26 NOTE — Telephone Encounter (Signed)
Rec'd disability forms ([facility])  via interoffice mail from [employer] - Fwd to nurse to have JN complete -pr

## 2017-06-06 ENCOUNTER — Telehealth: Payer: Self-pay | Admitting: Pulmonary Disease

## 2017-06-06 NOTE — Telephone Encounter (Signed)
Spoke with patient-she would like to know status of FMLA paperwork. Pt is aware that FMLA paperwork is through [employer]. Pt stated she has their phone number already and will contact them soon. Nothing more needed at this time.

## 2017-06-07 ENCOUNTER — Encounter: Payer: Self-pay | Admitting: Psychology

## 2017-06-07 ENCOUNTER — Encounter: Payer: Self-pay | Admitting: Family Medicine

## 2017-06-07 ENCOUNTER — Telehealth: Payer: Self-pay | Admitting: Pulmonary Disease

## 2017-06-07 ENCOUNTER — Ambulatory Visit (INDEPENDENT_AMBULATORY_CARE_PROVIDER_SITE_OTHER): Payer: Commercial Managed Care - PPO | Admitting: Family Medicine

## 2017-06-07 DIAGNOSIS — F418 Other specified anxiety disorders: Secondary | ICD-10-CM | POA: Diagnosis not present

## 2017-06-07 DIAGNOSIS — F32A Depression, unspecified: Secondary | ICD-10-CM

## 2017-06-07 DIAGNOSIS — F329 Major depressive disorder, single episode, unspecified: Secondary | ICD-10-CM

## 2017-06-07 MED ORDER — ESCITALOPRAM OXALATE 10 MG PO TABS: 10.0000 mg | ORAL_TABLET | Freq: Every day | ORAL | 0 refills | Status: AC
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
## 2017-06-07 NOTE — Assessment & Plan Note (Addendum)
Previous hx of depression on Zoloft which she self d/c due to S/E. Her GAD7 and PHQ9 are pretty high. I recommended Lexapro at 10 mg qd with CBT. Dr. Pascal LuxKane consulted who spent time counseling her; she will plug her in with the available resources. She requested for FMLA for her anxiety and stress. I will complete form once I get it. Return precaution discussed.  More than 50% of this face to face encounter was spent on counseling and coordination of care.

## 2017-06-07 NOTE — Progress Notes (Signed)
Subjective:     Patient ID: Ariel Henson, female   DOB: [birth year], 31 y.o.   MRN: [id number]  Anxiety  Presents for initial (She is stressed at work, an ongoing investigation with HR and her supervisor is treating her differently. She had a big arguement. She had not been back to work yet. She feels like she is being bullied at work. ) visit. 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 The problem has been gradually worsening. Symptoms include insomnia and nervous/anxious behavior. Patient reports no chest pain, palpitations or suicidal ideas. Primary symptoms comment: She got an unfair yearly rating and she got upset about it. At times her heart beats fast whenever she is stressed. The severity of symptoms is moderate. The symptoms are aggravated by work stress. The patient sleeps 4 hours per night. The quality of sleep is poor. Nighttime awakenings: several.   Risk factors: Stress at work. Her past medical history is significant for depression. There is no history of suicide attempts. (She was on Zoloft for a while for depression but self discontinued it due to s/e and she has coping well.) Past treatments include nothing.   Current Outpatient Prescriptions on File Prior to Visit  Medication Sig Dispense Refill  . predniSONE (DELTASONE) 10 MG tablet Take 1 tablet (10 mg total) by mouth daily with breakfast. 30 tablet 2  . albuterol (PROVENTIL HFA;VENTOLIN HFA) 108 (90 Base) MCG/ACT inhaler Inhale into the lungs every 6 (six) hours as needed for wheezing or shortness of breath.    Marland Kitchen HYDROcodone-acetaminophen (NORCO/VICODIN) 5-325 MG tablet Take 2 tablets by mouth every 4 (four) hours as needed. (Patient not taking: Reported on 05/04/2017) 16 tablet 0  . omeprazole (PRILOSEC) 20 MG capsule Take 1 capsule (20 mg total) by mouth daily. (Patient not taking: Reported on 05/04/2017) 30 capsule 1   No current  facility-administered medications on file prior to visit.    Past Medical History:  Diagnosis Date  . Allergic rhinitis   . Depression   . Ganglion cyst of wrist 05/2016   left  . Sarcoidosis      Vitals:   06/07/17 1052  BP: 100/60  Pulse: 74  Temp: 98.8 F (37.1 C)  TempSrc: Oral  Weight: 112 lb (50.8 kg)  Height: 5\' 4"  (1.626 m)    Review of Systems  Respiratory: Negative.   Cardiovascular: Negative.  Negative for chest pain and palpitations.  Gastrointestinal: Negative.   Musculoskeletal: Negative.   Neurological: Negative.   Psychiatric/Behavioral: Negative for suicidal ideas. The patient is nervous/anxious and has insomnia.   All other systems reviewed and are negative.      Objective:   Physical Exam  Constitutional: She is oriented to person, place, and time. She appears well-developed. No distress.  Cardiovascular: Normal rate, regular rhythm and normal heart sounds.   No murmur heard. Pulmonary/Chest: Effort normal and breath sounds normal. No respiratory distress. She has no wheezes. She exhibits no tenderness.  Abdominal: Soft. Bowel sounds are normal. She exhibits no distension and no mass. There is no tenderness.  Musculoskeletal: Normal range of motion. She exhibits no edema.  Neurological: She is alert and oriented to person, place, and time.  Psychiatric: Her speech is normal and behavior is normal. Judgment and thought content normal. Her mood appears anxious. Cognition and memory are normal. She expresses no homicidal and no suicidal ideation. She expresses no suicidal plans  and no homicidal plans.  Nursing note and vitals reviewed.      Assessment:     Anxiety with Depression    Plan:     Check problem list

## 2017-06-07 NOTE — Telephone Encounter (Signed)
Forms have been found in JN's cubby. JN will be back in the office on 8/23.   Left a detailed message for patient.   Will route to JN so he is aware to sign these once he returns to office.

## 2017-06-07 NOTE — Assessment & Plan Note (Signed)
MDQ is positive with 8 responses.  I misread it when she was here and thought it was a negative screen.  I did not get a chance to review answers with her.  She does not have a significant mental health history that is suggestive of severe episodic mood issues.  However, with a positive MDQ and a poor response to Zoloft, need to be mindful that antidepressants might not be a great match for her.    Will discuss with Dr. Lum BabeEniola.  Recommended Employee Assistance Program if [employer] has one.  I tried to find out but was not permitted this information without disclosing patient's name.  Patient reported she would call.  She is to call me if they do not and we will get her connected with a counselor.  Counseling and FMLA are the two things she mentioned were most important to her today.  Need to monitory alcohol intake.  Evidence of binge drinking this past month - may be stress related.  Not sure how much a "drink" of vodka is.    I will call tomorrow to check-in regardless.  Did some brief psychoed around the fight or flight response so she can better understand her physiological response to the stress she is under.  Would also like to tie that to how challenging it is to respond "professionally" when emotionally loaded like this.

## 2017-06-07 NOTE — Telephone Encounter (Signed)
Patient called back. The forms are actually due on 8/22.  JN, please advise. Thanks.

## 2017-06-07 NOTE — Patient Instructions (Addendum)
It was nice seeing you today. It sounds like you have anxiety and depression triggered by stress. I have started you on medication for both. Please start meds as soon as possible. Also schedule follow-up with behavioral specialist as planned and 4 week follow-up with your PCP. If you have any concern or reaction to your new meds, please call us.    Generalized Anxiety Disorder, Adult Generalized anxiety disorder (GAD) is a mental health disorder. People with this condition constantly worry about everyday events. Unlike normal anxiety, worry related to GAD is not triggered by a specific event. These worries also do not fade or get better with time. GAD interferes with life functions, including relationships, work, and school. GAD can vary from mild to severe. People with severe GAD can have intense waves of anxiety with physical symptoms (panic attacks). What are the causes? The exact cause of GAD is not known. What increases the risk? This condition is more likely to develop in:  Women.  People who have a family history of anxiety disorders.  People who are very shy.  People who experience very stressful life events, such as the death of a loved one.  People who have a very stressful family environment.  What are the signs or symptoms? People with GAD often worry excessively about many things in their lives, such as their health and family. They may also be overly concerned about:  Doing well at work.  Being on time.  Natural disasters.  Friendships.  Physical symptoms of GAD include:  Fatigue.  Muscle tension or having muscle twitches.  Trembling or feeling shaky.  Being easily startled.  Feeling like your heart is pounding or racing.  Feeling out of breath or like you cannot take a deep breath.  Having trouble falling asleep or staying asleep.  Sweating.  Nausea, diarrhea, or irritable bowel syndrome (IBS).  Headaches.  Trouble concentrating or remembering  facts.  Restlessness.  Irritability.  How is this diagnosed? Your health care provider can diagnose GAD based on your symptoms and medical history. You will also have a physical exam. The health care provider will ask specific questions about your symptoms, including how severe they are, when they started, and if they come and go. Your health care provider may ask you about your use of alcohol or drugs, including prescription medicines. Your health care provider may refer you to a mental health specialist for further evaluation. Your health care provider will do a thorough examination and may perform additional tests to rule out other possible causes of your symptoms. To be diagnosed with GAD, a person must have anxiety that:  Is out of his or her control.  Affects several different aspects of his or her life, such as work and relationships.  Causes distress that makes him or her unable to take part in normal activities.  Includes at least three physical symptoms of GAD, such as restlessness, fatigue, trouble concentrating, irritability, muscle tension, or sleep problems.  Before your health care provider can confirm a diagnosis of GAD, these symptoms must be present more days than they are not, and they must last for six months or longer. How is this treated? The following therapies are usually used to treat GAD:  Medicine. Antidepressant medicine is usually prescribed for long-term daily control. Antianxiety medicines may be added in severe cases, especially when panic attacks occur.  Talk therapy (psychotherapy). Certain types of talk therapy can be helpful in treating GAD by providing support, education, and guidance. Options  include: ? Cognitive behavioral therapy (CBT). People learn coping skills and techniques to ease their anxiety. They learn to identify unrealistic or negative thoughts and behaviors and to replace them with positive ones. ? Acceptance and commitment therapy (ACT).  This treatment teaches people how to be mindful as a way to cope with unwanted thoughts and feelings. ? Biofeedback. This process trains you to manage your body's response (physiological response) through breathing techniques and relaxation methods. You will work with a therapist while machines are used to monitor your physical symptoms.  Stress management techniques. These include yoga, meditation, and exercise.  A mental health specialist can help determine which treatment is best for you. Some people see improvement with one type of therapy. However, other people require a combination of therapies. Follow these instructions at home:  Take over-the-counter and prescription medicines only as told by your health care provider.  Try to maintain a normal routine.  Try to anticipate stressful situations and allow extra time to manage them.  Practice any stress management or self-calming techniques as taught by your health care provider.  Do not punish yourself for setbacks or for not making progress.  Try to recognize your accomplishments, even if they are small.  Keep all follow-up visits as told by your health care provider. This is important. Contact a health care provider if:  Your symptoms do not get better.  Your symptoms get worse.  You have signs of depression, such as: ? A persistently sad, cranky, or irritable mood. ? Loss of enjoyment in activities that used to bring you joy. ? Change in weight or eating. ? Changes in sleeping habits. ? Avoiding friends or family members. ? Loss of energy for normal tasks. ? Feelings of guilt or worthlessness. Get help right away if:  You have serious thoughts about hurting yourself or others. If you ever feel like you may hurt yourself or others, or have thoughts about taking your own life, get help right away. You can go to your nearest emergency department or call:  Your local emergency services (911 in the [place]  A suicide  crisis helpline, such as the National Suicide Prevention Lifeline at (587)862-70131-678-806-7992. This is open 24 hours a day.  Summary  Generalized anxiety disorder (GAD) is a mental health disorder that involves worry that is not triggered by a specific event.  People with GAD often worry excessively about many things in their lives, such as their health and family.  GAD may cause physical symptoms such as restlessness, trouble concentrating, sleep problems, frequent sweating, nausea, diarrhea, headaches, and trembling or muscle twitching.  A mental health specialist can help determine which treatment is best for you. Some people see improvement with one type of therapy. However, other people require a combination of therapies. This information is not intended to replace advice given to you by your health care provider. Make sure you discuss any questions you have with your health care provider. Document Released: 02/05/2013 Document Revised: 08/31/2016 Document Reviewed: 08/31/2016 Elsevier Interactive Patient Education  [employer]2018 Elsevier Inc.

## 2017-06-07 NOTE — Telephone Encounter (Signed)
Hate to make Mellody DanceKeith a courier but he'll have to run them over to [facility] on Friday morning for me to sign or sooner if [facility]'s [facility]. Thanks.

## 2017-06-08 NOTE — Telephone Encounter (Signed)
Spoke with Mellody DanceKeith who has agreed to take forms [facility].   Forms have been given to [place].   Forwarding to JN to make aware.

## 2017-06-08 NOTE — Telephone Encounter (Signed)
I have them [facility]'ll [facility] Friday [facility].

## 2017-06-08 NOTE — Telephone Encounter (Signed)
Will ask Mellody DanceKeith when he returns to the office if he will be willing to [facility] so [facility] can sign it.

## 2017-06-13 NOTE — Telephone Encounter (Signed)
FMLA forms have been signed by Paula Libra and have been given to [facility] to fax back to [employer]. Pt has been made aware and voiced her understanding.

## 2017-06-13 NOTE — Telephone Encounter (Signed)
Pt wishing to speak with someone again about her FMLA papers.  [phone]

## 2017-06-13 NOTE — Telephone Encounter (Signed)
Rec'd completed forms back from [employer] - fwd to [employer] via [employer] - pr

## 2017-06-13 NOTE — Telephone Encounter (Signed)
I will check with Mellody Dance this afternoon to see if he has forms, as he is currently in a meeting.

## 2017-06-14 ENCOUNTER — Telehealth: Payer: Self-pay | Admitting: Family Medicine

## 2017-06-14 ENCOUNTER — Telehealth: Payer: Self-pay | Admitting: Psychology

## 2017-06-14 ENCOUNTER — Encounter: Payer: Self-pay | Admitting: Family Medicine

## 2017-06-14 NOTE — Telephone Encounter (Signed)
Patient saw Dr. Lum Babe for anxiety on 06/07/17 and her note states that she offered to fill out FMLA paperwork for patient at that time. Will forward to see if she has any insight into what letter patient needs. Thank you.

## 2017-06-14 NOTE — Telephone Encounter (Signed)
Patient called requesting an appointment.  She has decided she doesn't want to go through any counseling service that could be connected to [place] (i.e. An EAP).  She would like to schedule with me.  She is concerned that HR is trying to get her to file workman's comp thinking that what brought her to the doctor recently was "work related."  She is concerned about what this might mean for her and has put in information so that she might speak to a Clinical research associate.  She remains very stressed.    Scheduled her for Thursday, 06/16/17 at 8:30.

## 2017-06-14 NOTE — Progress Notes (Signed)
I called and discussed work letter with patient. She wanted me to fax it to her work. Fax: [phone].  Appointment made for her to follow-up with her PCP for further management. She agreed with plan. She will also make f/u with Dr. Pascal Lux.

## 2017-06-14 NOTE — Telephone Encounter (Signed)
Pt is calling because she needs a letter stating why she has been out of work since 06/03/17 and she is suppose to go back on 06/20/17. Please call patient with questions and when the letter is ready to pick up. jw

## 2017-06-14 NOTE — Telephone Encounter (Signed)
Letter completed and faxed to work at her request,.

## 2017-06-15 ENCOUNTER — Telehealth: Payer: Self-pay | Admitting: Pulmonary Disease

## 2017-06-15 NOTE — Telephone Encounter (Signed)
RA will not be in office until 07/04/17.  Paperwork is in JN's cubby for lookat.  Will route to LL to follow up on.

## 2017-06-15 NOTE — Telephone Encounter (Signed)
Spoke with patient. She stated that Ariel Henson had a issue with the dates on her paperwork. They stated that in order for her to be covered, she will need to be approved for up to 4 episodes per every 6 months. Ariel Henson is in the process of faxing the forms to our office to be filled out again. The original due date was August 23rd.   Advised patient that I could call Ariel Henson and see about getting an extension on when her paperwork is due since JN will not be in the office until tomorrow. Her claim # is [id number] IFN. Ariel Henson # [phone]  Due date has been extended until Sept. 4th. Will route this to JN so that he is aware. Will also continue to check our faxes machines for the paperwork.

## 2017-06-15 NOTE — Telephone Encounter (Signed)
Rec'd request to correct/update FMLA forms via fax from [facility] - fax states that form is due today - placed in JN's folder up front -pr

## 2017-06-15 NOTE — Telephone Encounter (Signed)
She actually follows w/ Dr. Vassie Loll. If he's in office he can do the paperwork. J.

## 2017-06-16 ENCOUNTER — Ambulatory Visit (INDEPENDENT_AMBULATORY_CARE_PROVIDER_SITE_OTHER): Payer: Commercial Managed Care - PPO | Admitting: Psychology

## 2017-06-16 ENCOUNTER — Telehealth: Payer: Self-pay | Admitting: Family Medicine

## 2017-06-16 DIAGNOSIS — F32A Depression, unspecified: Secondary | ICD-10-CM

## 2017-06-16 DIAGNOSIS — F329 Major depressive disorder, single episode, unspecified: Secondary | ICD-10-CM

## 2017-06-16 NOTE — Telephone Encounter (Signed)
I'm sorry but her pattern that I can discern she is not having exacerbations more frequently than 2 every 6 months at most. RA is more familiar with her care than I am. I do not believe there is a way I can change the way I answered the questions previously. Perhaps her PCP can address this. Sorry.

## 2017-06-16 NOTE — Telephone Encounter (Signed)
Notes from Dr. Lum Babe and Dr. Pascal Lux have been printed and faxed with form.  LM for patient letting her know this has been done. Camillo Quadros,CMA

## 2017-06-16 NOTE — Telephone Encounter (Signed)
Form completed and returned to Jazmin.Release of medical information completed by patient and scanned to file. Please attach medical record to form when faxing to her work.

## 2017-06-16 NOTE — Assessment & Plan Note (Signed)
Still not clear on the nature of her mood issues:  Major depression vs. Adjustment disorder with anxiety and depression vs. Other mood issue.  She denies any difficulty with the Lexapro.  She thinks it might be helping even though she is just taking sporadically.  Provided some education here about the nature of the medicine and she thinks taking it consistently for at least two weeks seems reasonable.  Discussed work situation at [employer] and concluded that getting back to work is good.  It will be hard and anxiety provoking.  Getting clear on what she would like is a good first step.  Joaquim Nam may not be able or willing to accommodate.  She does not have control over this.    Looked at coping skills for the next week including both cognitive and behavioral skills.    See patient instructions for further plan.

## 2017-06-16 NOTE — Telephone Encounter (Signed)
Patient saw Dr. Lum Babe for anxiety on 06/07/17 and her note states that she offered to fill out FMLA paperwork for patient at that time. Will forward to see if she has any insight into what patient needs for FMLA.

## 2017-06-16 NOTE — Patient Instructions (Signed)
Please schedule a follow-up for:  August 29th at 8:30. We talked about next steps.  This is hard.  You can do hard things.  Please explore the app CALM on your smartphone with a focus on breathing.  You said setting an alarm to take your medicine was reasonable.  Remember wise mind.  Good to use your feelings and also your rational thinking.  You can call me at [phone] if you need to.

## 2017-06-16 NOTE — Telephone Encounter (Signed)
FMLA form dropped off for at front desk for completion.  Verified that patient section of form has been completed.  Last DOS with PCP/team was 06/07/17.  Placed form in team folder to be completed by clinical staff.  Chari Manning

## 2017-06-16 NOTE — Telephone Encounter (Signed)
Clinical info completed on FMLA form.  Place form in Dr.Yoo's box for completion.  Ariel Henson,  [name], CMA   

## 2017-06-16 NOTE — Progress Notes (Signed)
Reason for follow-up:  Dae presents after a warm hand-off.  She is having some work related stress.  She was diagnosed by Dr. Lum Babe with depression and anxiety and started on Lexapro recently.  Issues discussed:  MDQ and mood issues:  She had a positive MDQ last visit.  She does report periods of increased activity coupled with several symptoms of hypomania.  The time period for these periods is usually a day or less.  She has periods of decreased activity and energy that she describes are mostly on the weekend that she "shakes off" so she can go back to work on Monday.  She thinks she should get out of the house more but thinks people are "mean" and often stays inside.  She is on the fence as to whether she feels satisfied or content - not sure if she is just talking herself into the idea that staying home is preferable vs. She doesn't have the energy or interest to go out.    Work:  She reports a history of about 5 jobs in the past.  Denies a history of having difficulty (for the most part).  Has been at [employer] for nearly five years.  All good reviews with the exception of this last one.  She contacted HR on the heels of this review and there is an "investigation" per her request.    She is not interested in returning to her old job at [employer].  She thinks there are openings in other departments and she would like to try that.  She is concerned about how people in those departments might perceive her.    She is not interested in [employer] but feels pushed that way by her job.  We discussed.    Medication:  Taking the medicine intermittently.  3 doses missed in the last week.  She is ambivalent about needing it.

## 2017-06-17 NOTE — Telephone Encounter (Signed)
Spoke with patient, aware of rec's per Dr Jamison Neighbor. Patient is very unhappy with this and does not agree with her PCP filling out this paperwork as they have not seen her for her Sarcoid in several years. Pt is upset because her paperwork went to a different provider after she was told it would go to Dr Vassie Loll. I advised that Dr Vassie Loll has not been in office very much over the last several weeks. I apologized for the miscommunication and the delay in getting her paperwork. Pt very upset stating that she has already gotten an extension on this once already and this process is taking entirely too long -- almost 1 month. Pt is asking to speak with a Software engineer today to get answers as to why this process Is taking so long. Advised that I will pass this message along and someone will call her this afternoon.  By the end of our conversation that patient was not as upset and was willing to allow time to review her chart to figure out where the miscommunication or issues started - aware that myself or a Supervisor would contact her back today.    Dr Vassie Loll has only been in office 1 time since the paperwork was received 06/07/17.   Will send to [facility] to contact the patient this afternoon.

## 2017-06-17 NOTE — Telephone Encounter (Signed)
Called and spoke to pt. Apologized that there has been issues with her FMLA forms. Pt states that she would prefer to have RA complete the forms since he is her primary pulmonologist, pt aware he is not back in the office till 07/04/2017. RA is however in the hospital next week. Will call [employer] to see if they can grant another extension and see if FMLA forms can be sent to RA in the hospital to complete.   Will call [facility] on 06/20/2017.

## 2017-06-20 ENCOUNTER — Telehealth: Payer: Self-pay | Admitting: *Deleted

## 2017-06-20 NOTE — Telephone Encounter (Signed)
[place], RN pertaining to Pts disability claim has a few questions for the provider about dates and her next treatment steps.  Fleeger, Maryjo Rochester, CMA

## 2017-06-20 NOTE — Telephone Encounter (Signed)
Called and spoke to [place] with [employer] and was advised that the forms are being re-faxed to have RA complete the revisions on the other absences from work. Pt has been out of work 9 times from 05/03/2017 to now. Forms are being faxed to triage fax. Forms may need to be taken to RA while in hospital rounding since RA is not back in office till 07/04/2017. LMTCB for pt to make her aware.   RA please advise if ok to send these forms to you while you are in the hospital rounding to complete. Thanks.

## 2017-06-20 NOTE — Telephone Encounter (Signed)
I last saw her in 08/2016 I have offered her referral at [facility] which she turned around She was seen by my partner Dr. Marlyne Beards in 04/2017 and given low-dose prednisone and asked to follow-up in 2 weeks-this follow-up was not done. 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 I cannot justify her absence from work on her FMLA since x-ray has cleared. She would have to approach her PCP

## 2017-06-20 NOTE — Telephone Encounter (Signed)
atc pt X2, line rang to fast busy signal.

## 2017-06-20 NOTE — Telephone Encounter (Signed)
Ariel Henson from [place] called, requesting call back at [phone]. -pr

## 2017-06-20 NOTE — Telephone Encounter (Signed)
I called the number listed with no response. Message left to call back.

## 2017-06-21 NOTE — Telephone Encounter (Signed)
I called the number [phone], it went straight to voice mail. Message left to call back. Please ask exactly what she is needing so we can address it if we can.

## 2017-06-21 NOTE — Telephone Encounter (Signed)
Attempted to contact pt. No answer, no option to leave a message. Will try back.  

## 2017-06-21 NOTE — Telephone Encounter (Signed)
[place] calls again.  Leaves different number this time, but still request a callback. New number is [phone]. Demetrius Mahler, Maryjo Rochester, CMA

## 2017-06-22 ENCOUNTER — Telehealth: Payer: Self-pay | Admitting: Pulmonary Disease

## 2017-06-22 ENCOUNTER — Ambulatory Visit: Payer: Commercial Managed Care - PPO | Admitting: Psychology

## 2017-06-22 ENCOUNTER — Telehealth: Payer: Self-pay | Admitting: Psychology

## 2017-06-22 NOTE — Telephone Encounter (Signed)
Patient missed appointment this am and did not call.  I called and left a VM to check-in.

## 2017-06-22 NOTE — Telephone Encounter (Signed)
LM x 2

## 2017-06-22 NOTE — Telephone Encounter (Signed)
Called and spoke to pt. Informed her of the recs per RA. Pt upset that her absent days will not be excused from work and request to speak with Estate agentDirector of practice. Advised pt that I was a clinical TL, pt still requested to speak with director. Informed pt it would have to be next week before message could get to ComunasKeith.   Will hold message in triage till message is addressed by Maggie FontKeith Cottle.

## 2017-06-23 NOTE — Telephone Encounter (Signed)
lmtcb x3 for pt. 

## 2017-06-24 NOTE — Telephone Encounter (Signed)
We have attempted to contact the pt on several occassions with no call back. Per triage protocol, message will be closed.

## 2017-06-24 NOTE — Telephone Encounter (Signed)
Spoke with Mellody DanceKeith, he has left pt a VM. Will await call.

## 2017-06-28 ENCOUNTER — Telehealth: Payer: Self-pay | Admitting: Family Medicine

## 2017-06-28 ENCOUNTER — Ambulatory Visit: Payer: Commercial Managed Care - PPO | Admitting: Internal Medicine

## 2017-06-28 ENCOUNTER — Telehealth: Payer: Self-pay | Admitting: Psychology

## 2017-06-28 NOTE — Telephone Encounter (Signed)
Not sure if you want her to change her PCP to you, Dr. Lum BabeEniola?

## 2017-06-28 NOTE — Telephone Encounter (Signed)
Do not make all her appointment with Ariel Henson. She need to see her PCP. Please remind her she has a follow-up appointment with [facility] today.  I am not clear why she need a date change on her form. Please direct subsequent call and request to her PCP. Thanks.

## 2017-06-28 NOTE — Telephone Encounter (Signed)
Pt needs to have dates corrected on her FMLA paperwork. She is Dr. Artist PaisYoo patient but she only wants to have Dr. Lum BabeEniola fill out the papers and she wants all her appointments with Dr. Lum BabeEniola. Please let patient know. Are we are going to changing the doctor to Dr. Lum BabeEniola ? Since we will not make appointments for her with Dr. 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 jw

## 2017-06-28 NOTE — Telephone Encounter (Signed)
Patient returned my phone call from a missed appointment.  Called her back and left a VM.

## 2017-06-30 ENCOUNTER — Ambulatory Visit: Payer: Commercial Managed Care - PPO | Admitting: Family Medicine

## 2017-07-01 NOTE — Telephone Encounter (Signed)
Patient no showed appointment with Dr. Nancy MarusMayo.  Jazmin Hartsell,CMA

## 2017-07-04 IMAGING — XA DG FLUORO GUIDE NDL PLC/BX
3 series · 4 of 4 positions shown · non-contrast
Comparison: none

CLINICAL DATA: Suspected collateral ligament injuries at the
metacarpophalangeal joints of the second, third, and fourth fingers.

[Series 1: ortho standard · 1 of 1 slices shown (1 of 3)]
[im 1/1]
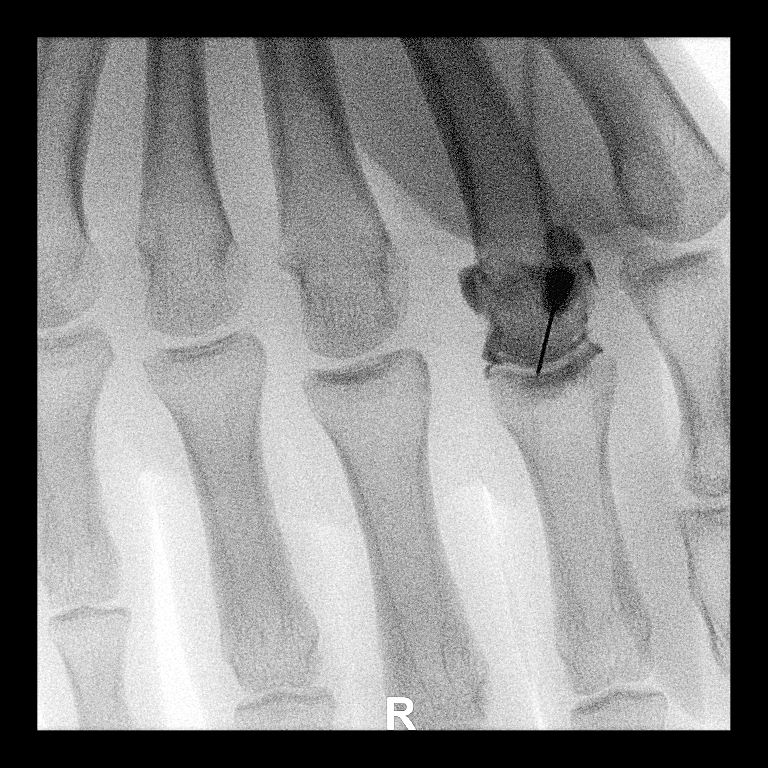

[Series 2: ortho standard · 1 of 1 slices shown (2 of 3)]
[im 1/1]
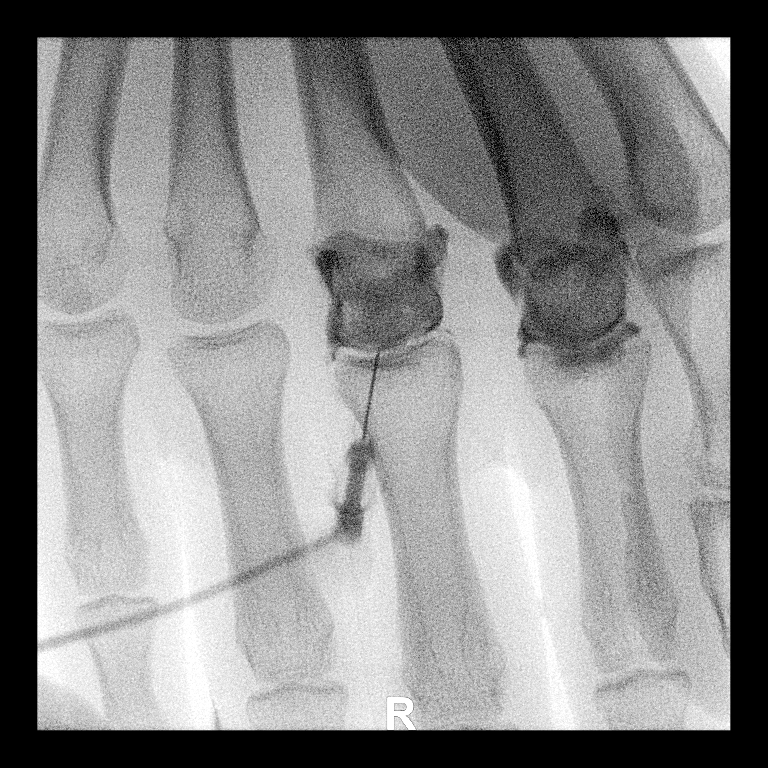

[Series 3: ortho standard · 2 of 2 slices shown (3 of 3)]
[im 1/2]
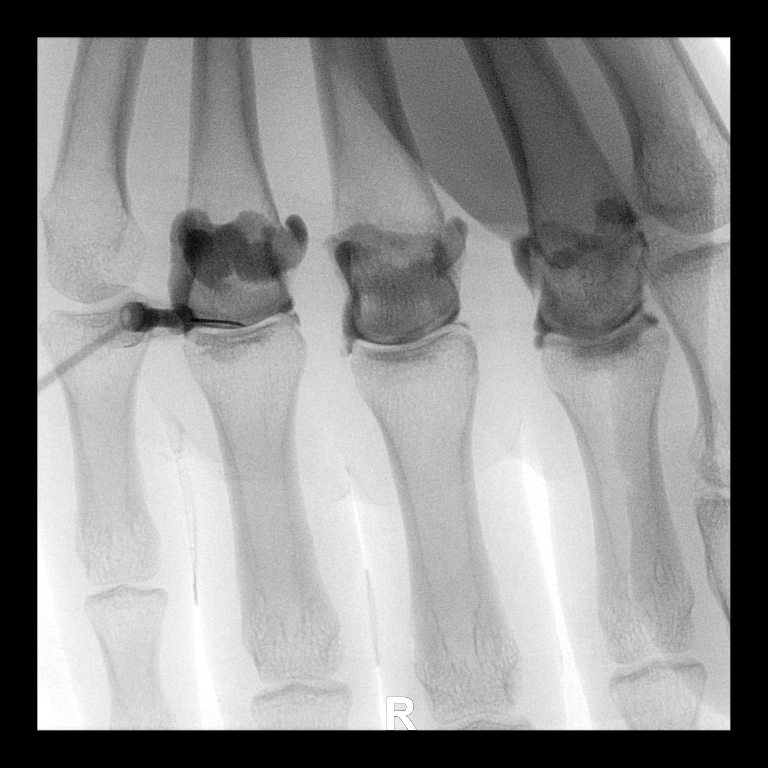
[im 2/2]
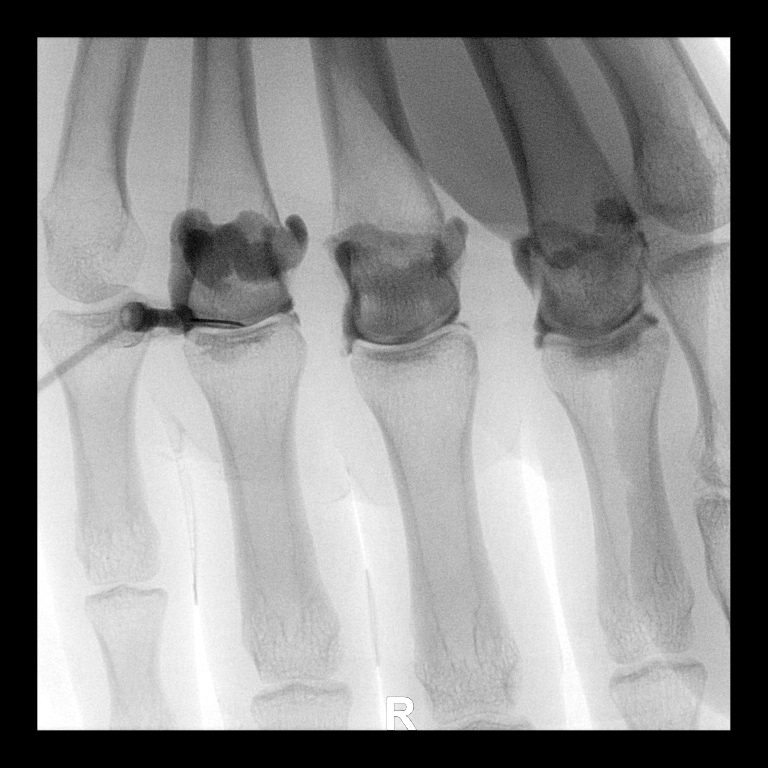

[4 of 4 positions shown; findings below may reference images not displayed]

FLUOROSCOPY TIME:  29 seconds corresponding to a Dose Area Product
of 0.85 ?Gy*m2

PROCEDURE:
RIGHT SECOND, THIRD, AND FOURTH MP ARTHROGRAM INJECTION FOR MRI
UNDER FLUOROSCOPY

After a thorough discussion of risks and benefits of the procedure,
written and oral informed consent was obtained. Time out form
Completed when appropriate. We discussed the high likelihood of good
diagnostic study.

Preliminary localization was performed over the dorsum of the RIGHT
hand.

After prep and drape in the usual sterile fashion, a 27 gauge
hypodermic needle was advanced into the index finger MP joint. The
joint was distended with approximately 1 ml of a [DATE] dilution of
Multihance contrast. The MR arthrogram solution was as follows:
ml of Isovue M 200 contrast agent, 0.05 mL Multihance , 2.5 ml of
Lidocaine. After opacification of the joint, injection was
discontinued, the needle removed, and a sterile dressing applied.

An identical procedure was performed at the long finger MP joint,
and the ring finger MP joint.

The patient was taken to MRI for subsequent imaging.

The patient tolerated the procedure well and there were no
complications.
IMPRESSION: Successful RIGHT second, third, and fourth MP joint fluoroscopically
guided injection in preparation for MR.

## 2022-02-18 ENCOUNTER — Other Ambulatory Visit: Payer: Self-pay

## 2022-02-18 ENCOUNTER — Encounter (HOSPITAL_BASED_OUTPATIENT_CLINIC_OR_DEPARTMENT_OTHER): Payer: Self-pay | Admitting: Emergency Medicine

## 2022-02-18 ENCOUNTER — Emergency Department (HOSPITAL_BASED_OUTPATIENT_CLINIC_OR_DEPARTMENT_OTHER): Payer: Commercial Managed Care - PPO

## 2022-02-18 ENCOUNTER — Emergency Department (HOSPITAL_BASED_OUTPATIENT_CLINIC_OR_DEPARTMENT_OTHER)
Admission: EM | Admit: 2022-02-18 | Discharge: 2022-02-18 | Disposition: A | Discharge status: Home / Self Care | Payer: Commercial Managed Care - PPO | Attending: Emergency Medicine | Admitting: Emergency Medicine

## 2022-02-18 DIAGNOSIS — R079 Chest pain, unspecified: Secondary | ICD-10-CM | POA: Diagnosis not present

## 2022-02-18 DIAGNOSIS — K529 Noninfective gastroenteritis and colitis, unspecified: Secondary | ICD-10-CM | POA: Diagnosis not present

## 2022-02-18 DIAGNOSIS — J069 Acute upper respiratory infection, unspecified: Secondary | ICD-10-CM | POA: Insufficient documentation

## 2022-02-18 DIAGNOSIS — Z7951 Long term (current) use of inhaled steroids: Secondary | ICD-10-CM | POA: Insufficient documentation

## 2022-02-18 DIAGNOSIS — J45909 Unspecified asthma, uncomplicated: Secondary | ICD-10-CM | POA: Insufficient documentation

## 2022-02-18 DIAGNOSIS — R197 Diarrhea, unspecified: Secondary | ICD-10-CM | POA: Diagnosis present

## 2022-02-18 DIAGNOSIS — Z20822 Contact with and (suspected) exposure to covid-19: Secondary | ICD-10-CM | POA: Diagnosis not present

## 2022-02-18 LAB — CBC WITH DIFFERENTIAL/PLATELET
Abs Immature Granulocytes: 0.02 10*3/uL (ref 0.00–0.07)
Basophils Absolute: 0 10*3/uL (ref 0.0–0.1)
Basophils Relative: 0 %
Eosinophils Absolute: 0.6 10*3/uL — ABNORMAL HIGH (ref 0.0–0.5)
Eosinophils Relative: 8 %
HCT: 43.1 % (ref 36.0–46.0)
Hemoglobin: 15.5 g/dL — ABNORMAL HIGH (ref 12.0–15.0)
Immature Granulocytes: 0 %
Lymphocytes Relative: 16 %
Lymphs Abs: 1.1 10*3/uL (ref 0.7–4.0)
MCH: 35.2 pg — ABNORMAL HIGH (ref 26.0–34.0)
MCHC: 36 g/dL (ref 30.0–36.0)
MCV: 98 fL (ref 80.0–100.0)
Monocytes Absolute: 0.7 10*3/uL (ref 0.1–1.0)
Monocytes Relative: 10 %
Neutro Abs: 4.4 10*3/uL (ref 1.7–7.7)
Neutrophils Relative %: 66 %
Platelets: 228 10*3/uL (ref 150–400)
RBC: 4.4 MIL/uL (ref 3.87–5.11)
RDW: 12.2 % (ref 11.5–15.5)
WBC: 6.7 10*3/uL (ref 4.0–10.5)
nRBC: 0 % (ref 0.0–0.2)

## 2022-02-18 LAB — RESP PANEL BY RT-PCR (FLU A&B, COVID) ARPGX2
Influenza A by PCR: NEGATIVE
Influenza B by PCR: NEGATIVE
SARS Coronavirus 2 by RT PCR: NEGATIVE

## 2022-02-18 LAB — BASIC METABOLIC PANEL
Anion gap: 7 (ref 5–15)
BUN: 11 mg/dL (ref 6–20)
CO2: 23 mmol/L (ref 22–32)
Calcium: 9.1 mg/dL (ref 8.9–10.3)
Chloride: 106 mmol/L (ref 98–111)
Creatinine, Ser: 0.92 mg/dL (ref 0.44–1.00)
GFR, Estimated: >60 mL/min (ref >60)
Glucose, Bld: 92 mg/dL (ref 70–99)
Potassium: 3.7 mmol/L (ref 3.5–5.1)
Sodium: 136 mmol/L (ref 135–145)

## 2022-02-18 LAB — TROPONIN I (HIGH SENSITIVITY): Troponin I (High Sensitivity): <2 ng/L (ref <18)

## 2022-02-18 MED ORDER — ONDANSETRON 4 MG PO TBDP: 4.0000 mg | ORAL_TABLET | Freq: Three times a day (TID) | ORAL | 0 refills | Status: AC | PRN

## 2022-02-18 MED ORDER — SODIUM CHLORIDE 0.9 % IV BOLUS
1000.0000 mL | Freq: Once | INTRAVENOUS | Status: AC
  Administered 2022-02-18: 1000 mL via INTRAVENOUS

## 2022-02-18 MED ORDER — BENZONATATE 100 MG PO CAPS: 100.0000 mg | ORAL_CAPSULE | Freq: Three times a day (TID) | ORAL | 0 refills | Status: AC

## 2022-02-18 MED ORDER — ALBUTEROL SULFATE HFA 108 (90 BASE) MCG/ACT IN AERS: 1.0000 | INHALATION_SPRAY | Freq: Four times a day (QID) | RESPIRATORY_TRACT | 0 refills | Status: AC | PRN

## 2022-02-18 MED ORDER — ALBUTEROL SULFATE HFA 108 (90 BASE) MCG/ACT IN AERS
2.0000 | INHALATION_SPRAY | Freq: Once | RESPIRATORY_TRACT | Status: AC
  Administered 2022-02-18: 2 via RESPIRATORY_TRACT
  Filled 2022-02-18: qty 6.7

## 2022-02-18 NOTE — ED Triage Notes (Signed)
Brother recently traveled to [place].  He had URI.  Pt started to have URI symptoms on Wednesday.  Yesterday pt started to have cough, chest pain, runny nose.  No known fever. ?

## 2022-02-18 NOTE — ED Notes (Signed)
Patient transported to X-ray 

## 2022-02-18 NOTE — Discharge Instructions (Signed)
Your work-up today was reassuring.  You likely have a viral upper respiratory infection potentially combined with gastroenteritis.  I have sent some medication in for your symptom management.  However it may take another few days before your symptoms start improving.  If you have any worsening of symptoms or new concerning symptoms please return to the emergency room for evaluation.  Also attached information for [facility] or you can call to establish primary care with. ?

## 2022-02-18 NOTE — ED Provider Notes (Signed)
?[facility] EMERGENCY DEPARTMENT ?Provider Note ? ? ?CSN: [id number] ?Arrival date & time: 02/18/22  1043 ? ?  ? ?History ? ?Chief Complaint  ?Patient presents with  ? URI  ? ? ?Ariel Henson is a 36 y.o. female. ? ?36 year old female presents today for evaluation of 2-day duration of URI symptoms, vomiting, and diarrhea.  She states her brother recently came back from [place] and had URI symptoms.  She met up with him Monday and then yesterday developed symptoms.  She denies fever.  She does have history of asthma and sarcoidosis.  She also reports right-sided chest pain which is constant and worse with coughing or exertion.  Denies fever. ? ?The history is provided by the patient. No language interpreter was used.  ? ?  ? ?Home Medications ?Prior to Admission medications   ?Medication Sig Start Date End Date Taking? Authorizing Provider  ?albuterol (PROVENTIL HFA;VENTOLIN HFA) 108 (90 Base) MCG/ACT inhaler Inhale into the lungs every 6 (six) hours as needed for wheezing or shortness of breath.    [provider]  ?escitalopram (LEXAPRO) 10 MG tablet Take 1 tablet (10 mg total) by mouth daily. 06/07/17   Kinnie Feil, MD  ?HYDROcodone-acetaminophen (NORCO/VICODIN) 5-325 MG tablet Take 2 tablets by mouth every 4 (four) hours as needed. ?Patient not taking: Reported on 05/04/2017 07/04/16   Fransico Meadow, PA-C  ?omeprazole (PRILOSEC) 20 MG capsule Take 1 capsule (20 mg total) by mouth daily. ?Patient not taking: Reported on 05/04/2017 03/24/17   McKeag, Marylynn Pearson, MD  ?predniSONE (DELTASONE) 10 MG tablet Take 1 tablet (10 mg total) by mouth daily with breakfast. 05/04/17   Javier Glazier, MD  ?   ? ?Allergies    ?Pollen extract   ? ?Review of Systems   ?Review of Systems  ?Constitutional:  Negative for chills and fever.  ?Respiratory:  Positive for shortness of breath.   ?Cardiovascular:  Positive for chest pain. Negative for palpitations and leg swelling.  ?Gastrointestinal:  Positive for diarrhea,  nausea and vomiting. Negative for abdominal pain.  ?Neurological:  Negative for light-headedness.  ?All other systems reviewed and are negative. ? ?Physical Exam ?Updated Vital Signs ?BP 117/71 (BP Location: Right Arm)   Pulse 71   Temp 98.4 ?F (36.9 ?C)   Resp 18   Ht _0  (1.626 m)   Wt 52.2 kg   LMP 01/30/2022 (Exact Date)   SpO2 100%   BMI 19.74 kg/m?  ?Physical Exam ?Vitals and nursing note reviewed.  ?Constitutional:   ?   General: She is not in acute distress. ?   Appearance: Normal appearance. She is not ill-appearing.  ?HENT:  ?   Head: Normocephalic and atraumatic.  ?   Nose: Nose normal.  ?Eyes:  ?   General: No scleral icterus. ?   Extraocular Movements: Extraocular movements intact.  ?   Conjunctiva/sclera: Conjunctivae normal.  ?Cardiovascular:  ?   Rate and Rhythm: Normal rate and regular rhythm.  ?   Pulses: Normal pulses.  ?   Comments: Right-sided chest tenderness present on palpation. ?Pulmonary:  ?   Effort: Pulmonary effort is normal. No respiratory distress.  ?   Breath sounds: Normal breath sounds. No wheezing or rales.  ?Abdominal:  ?   General: There is no distension.  ?   Tenderness: There is no abdominal tenderness. There is no guarding.  ?Musculoskeletal:     ?   General: Normal range of motion.  ?   Cervical back: Normal  range of motion.  ?Skin: ?   General: Skin is warm and dry.  ?Neurological:  ?   General: No focal deficit present.  ?   Mental Status: She is alert. Mental status is at baseline.  ? ? ?ED Results / Procedures / Treatments   ?Labs ?(all labs ordered are listed, but only abnormal results are displayed) ?Labs Reviewed  ?RESP PANEL BY RT-PCR (FLU A&B, COVID) [id number]  ?CBC WITH DIFFERENTIAL/PLATELET  ?BASIC METABOLIC PANEL  ?TROPONIN I (HIGH SENSITIVITY)  ? ? ?EKG ?None ? ?Radiology ?DG Chest 2 View ? ?Result Date: 02/18/2022 ?CLINICAL DATA:  uri EXAM: CHEST - 2 VIEW COMPARISON:  Chest x-ray 05/04/2017. FINDINGS: Hyperinflation and bilateral scarring. No  consolidation. No visible pleural effusions or pneumothorax. Cardiomediastinal silhouette is within normal limits. IMPRESSION: Hyperinflation and bilateral scarring. No consolidation. Electronically Signed   By: Margaretha Sheffield M.D.   On: 02/18/2022 11:32   ? ?Procedures ?Procedures  ? ? ?Medications Ordered in ED ?Medications  ?sodium chloride 0.9 % bolus 1,000 mL (has no administration in time range)  ?albuterol (VENTOLIN HFA) 108 (90 Base) MCG/ACT inhaler 2 puff (2 puffs Inhalation Given 02/18/22 1253)  ? ? ?ED Course/ Medical Decision Making/ A&P ?  ?                        ?Medical Decision Making ?Amount and/or Complexity of Data Reviewed ?Labs: ordered. ?Radiology: ordered. ? ?Risk ?Prescription drug management. ? ? ?36 year old female presents today for evaluation of 2-day duration of URI symptoms and vomiting and diarrhea.  Patient's brother who just returned from [place] has similar symptoms.  She did not travel.  She does have history of asthma and sarcoidosis.  She also has right-sided chest pain.  Will evaluate with CBC, BMP, troponin.  Chest x-ray which was already done does not show any evidence of acute cardiopulmonary process.  EKG does not show acute ischemic changes.  Will provide with fluid bolus as well. she is without any current shortness of breath.  She is well-appearing without acute distress. ?CBC without leukocytosis or anemia.  BMP without AKI or other acute concerns.  Troponin less than 2.  Given duration of symptoms, EKG, and troponin doubt ACS.  Will provide patient with symptomatic management.  Most likely viral URI with potential viral gastroenteritis as well.  Will provide albuterol inhaler, Zofran, Tessalon Perles.  Return precautions discussed.  Will provide patient with a PCP referral given she does not have a PCP.  She voices understanding and is in agreement with plan. ? ? ?Final Clinical Impression(s) / ED Diagnoses ?Final diagnoses:  ?Viral upper respiratory tract infection   ?Gastroenteritis  ? ? ?Rx / DC Orders ?ED Discharge Orders   ? ?      Ordered  ?  ondansetron (ZOFRAN-ODT) 4 MG disintegrating tablet  Every 8 hours PRN       ? 02/18/22 1349  ?  albuterol (VENTOLIN HFA) 108 (90 Base) MCG/ACT inhaler  Every 6 hours PRN       ? 02/18/22 1349  ?  benzonatate (TESSALON) 100 MG capsule  Every 8 hours       ? 02/18/22 1349  ? ?  ?  ? ?  ? ? ?  ?Evlyn Courier, PA-C ?02/18/22 1353 ? ?  ?Lianne Cure, DO ?72/53/66 1514 ? ?
